# Patient Record
Sex: Female | Born: 1950
Health system: Southern US, Community
[De-identification: ages and names within clinical notes are randomized; demographics above are authoritative.]

## PROBLEM LIST (undated history)

## (undated) DIAGNOSIS — R059 Cough, unspecified: Secondary | ICD-10-CM

## (undated) DIAGNOSIS — N814 Uterovaginal prolapse, unspecified: Secondary | ICD-10-CM

## (undated) DIAGNOSIS — R05 Cough: Secondary | ICD-10-CM

## (undated) DIAGNOSIS — I10 Essential (primary) hypertension: Secondary | ICD-10-CM

## (undated) HISTORY — DX: Cough: R05

## (undated) HISTORY — PX: KNEE SURGERY: SHX244

## (undated) HISTORY — DX: Essential (primary) hypertension: I10

## (undated) HISTORY — DX: Cough, unspecified: R05.9

---

## 2001-12-16 ENCOUNTER — Ambulatory Visit (HOSPITAL_COMMUNITY): Admission: RE | Admit: 2001-12-16 | Discharge: 2001-12-16 | Payer: Self-pay | Admitting: Internal Medicine

## 2002-11-14 ENCOUNTER — Encounter (INDEPENDENT_AMBULATORY_CARE_PROVIDER_SITE_OTHER): Payer: Self-pay | Admitting: Internal Medicine

## 2003-09-02 ENCOUNTER — Emergency Department (HOSPITAL_COMMUNITY): Admission: EM | Admit: 2003-09-02 | Discharge: 2003-09-02 | Payer: Self-pay | Admitting: Emergency Medicine

## 2003-09-26 ENCOUNTER — Encounter (HOSPITAL_COMMUNITY): Admission: RE | Admit: 2003-09-26 | Discharge: 2003-10-26 | Payer: Self-pay | Admitting: Family Medicine

## 2006-02-09 ENCOUNTER — Other Ambulatory Visit: Admission: RE | Admit: 2006-02-09 | Discharge: 2006-02-09 | Payer: Self-pay | Admitting: Internal Medicine

## 2006-02-09 ENCOUNTER — Ambulatory Visit: Payer: Self-pay | Admitting: Internal Medicine

## 2006-02-09 ENCOUNTER — Encounter (INDEPENDENT_AMBULATORY_CARE_PROVIDER_SITE_OTHER): Payer: Self-pay | Admitting: Specialist

## 2006-02-09 LAB — CONVERTED CEMR LAB: Pap Smear: NORMAL

## 2006-02-17 ENCOUNTER — Ambulatory Visit: Payer: Self-pay | Admitting: Family Medicine

## 2006-02-17 ENCOUNTER — Encounter (INDEPENDENT_AMBULATORY_CARE_PROVIDER_SITE_OTHER): Payer: Self-pay | Admitting: Internal Medicine

## 2006-02-18 ENCOUNTER — Encounter (INDEPENDENT_AMBULATORY_CARE_PROVIDER_SITE_OTHER): Payer: Self-pay | Admitting: Internal Medicine

## 2006-02-18 LAB — CONVERTED CEMR LAB
ALT: 24 units/L
AST: 30 units/L
Albumin: 4.4 g/dL
Alkaline Phosphatase: 78 units/L
BUN: 13 mg/dL
Basophils Absolute: 0.1 10*3/uL
Basophils Relative: 1 %
CO2: 24 meq/L
Calcium: 9 mg/dL
Chloride: 104 meq/L
Creatinine, Ser: 0.8 mg/dL
Eosinophils Absolute: 0.3 10*3/uL
Eosinophils Relative: 3 %
Glucose, Bld: 100 mg/dL
HCT: 44.5 %
Hemoglobin: 15.3 g/dL
Lymphocytes Relative: 18 %
Lymphs Abs: 1.8 10*3/uL
MCHC: 34.4 g/dL
MCV: 80.8 fL
Monocytes Absolute: 0.4 10*3/uL
Monocytes Relative: 4 %
Neutro Abs: 7.5 10*3/uL
Neutrophils Relative %: 74 %
Platelets: 255 10*3/uL
Potassium: 3.8 meq/L
RBC: 5.51 M/uL
RDW: 13.1 %
Sodium: 139 meq/L
TSH: 2.221 microintl units/mL
Total Bilirubin: 0.3 mg/dL
Total Protein: 7.8 g/dL
WBC: 10.1 10*3/uL

## 2006-02-26 ENCOUNTER — Ambulatory Visit: Payer: Self-pay | Admitting: Internal Medicine

## 2006-03-16 ENCOUNTER — Encounter (INDEPENDENT_AMBULATORY_CARE_PROVIDER_SITE_OTHER): Payer: Self-pay | Admitting: Internal Medicine

## 2006-03-16 ENCOUNTER — Encounter: Admission: RE | Admit: 2006-03-16 | Discharge: 2006-03-16 | Payer: Self-pay | Admitting: Internal Medicine

## 2006-03-24 ENCOUNTER — Encounter (INDEPENDENT_AMBULATORY_CARE_PROVIDER_SITE_OTHER): Payer: Self-pay | Admitting: Internal Medicine

## 2006-03-27 ENCOUNTER — Encounter (INDEPENDENT_AMBULATORY_CARE_PROVIDER_SITE_OTHER): Payer: Self-pay | Admitting: Internal Medicine

## 2006-11-10 ENCOUNTER — Ambulatory Visit: Payer: Self-pay | Admitting: Internal Medicine

## 2006-11-10 DIAGNOSIS — J309 Allergic rhinitis, unspecified: Secondary | ICD-10-CM | POA: Insufficient documentation

## 2006-11-10 DIAGNOSIS — I10 Essential (primary) hypertension: Secondary | ICD-10-CM | POA: Insufficient documentation

## 2006-11-10 DIAGNOSIS — N318 Other neuromuscular dysfunction of bladder: Secondary | ICD-10-CM

## 2006-11-10 DIAGNOSIS — Z8679 Personal history of other diseases of the circulatory system: Secondary | ICD-10-CM | POA: Insufficient documentation

## 2006-11-10 DIAGNOSIS — F411 Generalized anxiety disorder: Secondary | ICD-10-CM | POA: Insufficient documentation

## 2006-11-10 DIAGNOSIS — R87619 Unspecified abnormal cytological findings in specimens from cervix uteri: Secondary | ICD-10-CM

## 2006-11-10 LAB — CONVERTED CEMR LAB: Inflenza A Ag: NEGATIVE

## 2006-11-27 ENCOUNTER — Encounter (INDEPENDENT_AMBULATORY_CARE_PROVIDER_SITE_OTHER): Payer: Self-pay | Admitting: Internal Medicine

## 2007-01-07 ENCOUNTER — Telehealth (INDEPENDENT_AMBULATORY_CARE_PROVIDER_SITE_OTHER): Payer: Self-pay | Admitting: Internal Medicine

## 2007-01-12 ENCOUNTER — Encounter (INDEPENDENT_AMBULATORY_CARE_PROVIDER_SITE_OTHER): Payer: Self-pay | Admitting: Internal Medicine

## 2007-01-14 ENCOUNTER — Encounter (INDEPENDENT_AMBULATORY_CARE_PROVIDER_SITE_OTHER): Payer: Self-pay | Admitting: Internal Medicine

## 2007-01-15 ENCOUNTER — Ambulatory Visit: Payer: Self-pay | Admitting: Internal Medicine

## 2007-01-17 LAB — CONVERTED CEMR LAB
BUN: 12 mg/dL (ref 6–23)
CO2: 23 meq/L (ref 19–32)
Calcium: 9.5 mg/dL (ref 8.4–10.5)
Chloride: 103 meq/L (ref 96–112)
Cholesterol: 174 mg/dL (ref 0–200)
Creatinine, Ser: 0.69 mg/dL (ref 0.40–1.20)
Glucose, Bld: 88 mg/dL (ref 70–99)
HDL: 71 mg/dL (ref >39)
LDL Cholesterol: 80 mg/dL (ref 0–99)
Potassium: 3.8 meq/L (ref 3.5–5.3)
Sodium: 140 meq/L (ref 135–145)
Total CHOL/HDL Ratio: 2.5
Triglycerides: 114 mg/dL (ref <150)
VLDL: 23 mg/dL (ref 0–40)

## 2007-07-12 ENCOUNTER — Telehealth (INDEPENDENT_AMBULATORY_CARE_PROVIDER_SITE_OTHER): Payer: Self-pay | Admitting: Internal Medicine

## 2007-08-26 ENCOUNTER — Ambulatory Visit: Payer: Self-pay | Admitting: Internal Medicine

## 2007-08-27 ENCOUNTER — Telehealth (INDEPENDENT_AMBULATORY_CARE_PROVIDER_SITE_OTHER): Payer: Self-pay | Admitting: *Deleted

## 2007-08-27 LAB — CONVERTED CEMR LAB
BUN: 10 mg/dL (ref 6–23)
CO2: 23 meq/L (ref 19–32)
Calcium: 9.2 mg/dL (ref 8.4–10.5)
Chloride: 104 meq/L (ref 96–112)
Creatinine, Ser: 0.73 mg/dL (ref 0.40–1.20)
Glucose, Bld: 90 mg/dL (ref 70–99)
Potassium: 3.8 meq/L (ref 3.5–5.3)
Sodium: 143 meq/L (ref 135–145)

## 2007-09-18 ENCOUNTER — Emergency Department (HOSPITAL_COMMUNITY): Admission: EM | Admit: 2007-09-18 | Discharge: 2007-09-18 | Payer: Self-pay | Admitting: Emergency Medicine

## 2007-11-09 ENCOUNTER — Ambulatory Visit: Payer: Self-pay | Admitting: Internal Medicine

## 2007-11-25 ENCOUNTER — Telehealth (INDEPENDENT_AMBULATORY_CARE_PROVIDER_SITE_OTHER): Payer: Self-pay | Admitting: Internal Medicine

## 2007-12-02 ENCOUNTER — Ambulatory Visit: Payer: Self-pay | Admitting: Internal Medicine

## 2007-12-16 ENCOUNTER — Ambulatory Visit: Payer: Self-pay | Admitting: Internal Medicine

## 2007-12-24 ENCOUNTER — Telehealth (INDEPENDENT_AMBULATORY_CARE_PROVIDER_SITE_OTHER): Payer: Self-pay | Admitting: Internal Medicine

## 2008-03-14 ENCOUNTER — Ambulatory Visit: Payer: Self-pay | Admitting: Internal Medicine

## 2008-03-14 DIAGNOSIS — J45901 Unspecified asthma with (acute) exacerbation: Secondary | ICD-10-CM | POA: Insufficient documentation

## 2008-03-14 DIAGNOSIS — J45909 Unspecified asthma, uncomplicated: Secondary | ICD-10-CM | POA: Insufficient documentation

## 2008-03-15 LAB — CONVERTED CEMR LAB
BUN: 10 mg/dL (ref 6–23)
CO2: 24 meq/L (ref 19–32)
Calcium: 9.6 mg/dL (ref 8.4–10.5)
Chloride: 104 meq/L (ref 96–112)
Cholesterol: 181 mg/dL (ref 0–200)
Creatinine, Ser: 0.65 mg/dL (ref 0.40–1.20)
Glucose, Bld: 93 mg/dL (ref 70–99)
HDL: 66 mg/dL (ref >39)
LDL Cholesterol: 91 mg/dL (ref 0–99)
Potassium: 3.4 meq/L — ABNORMAL LOW (ref 3.5–5.3)
Sodium: 144 meq/L (ref 135–145)
Total CHOL/HDL Ratio: 2.7
Triglycerides: 118 mg/dL (ref <150)
VLDL: 24 mg/dL (ref 0–40)

## 2008-03-28 ENCOUNTER — Ambulatory Visit: Payer: Self-pay | Admitting: Internal Medicine

## 2008-03-28 DIAGNOSIS — K4090 Unilateral inguinal hernia, without obstruction or gangrene, not specified as recurrent: Secondary | ICD-10-CM | POA: Insufficient documentation

## 2008-03-28 DIAGNOSIS — E876 Hypokalemia: Secondary | ICD-10-CM

## 2008-03-29 LAB — CONVERTED CEMR LAB: Potassium: 3.4 meq/L — ABNORMAL LOW (ref 3.5–5.3)

## 2008-05-12 ENCOUNTER — Emergency Department (HOSPITAL_COMMUNITY): Admission: EM | Admit: 2008-05-12 | Discharge: 2008-05-12 | Payer: Self-pay | Admitting: Emergency Medicine

## 2008-05-16 ENCOUNTER — Ambulatory Visit: Payer: Self-pay | Admitting: Internal Medicine

## 2008-06-01 ENCOUNTER — Ambulatory Visit: Payer: Self-pay | Admitting: Internal Medicine

## 2008-06-12 ENCOUNTER — Encounter (INDEPENDENT_AMBULATORY_CARE_PROVIDER_SITE_OTHER): Payer: Self-pay | Admitting: Internal Medicine

## 2008-07-03 ENCOUNTER — Telehealth (INDEPENDENT_AMBULATORY_CARE_PROVIDER_SITE_OTHER): Payer: Self-pay | Admitting: Internal Medicine

## 2008-07-13 ENCOUNTER — Ambulatory Visit: Payer: Self-pay | Admitting: Internal Medicine

## 2008-09-05 ENCOUNTER — Ambulatory Visit: Payer: Self-pay | Admitting: Internal Medicine

## 2008-09-05 DIAGNOSIS — J069 Acute upper respiratory infection, unspecified: Secondary | ICD-10-CM | POA: Insufficient documentation

## 2009-04-20 ENCOUNTER — Ambulatory Visit: Payer: Self-pay | Admitting: Internal Medicine

## 2009-04-23 LAB — CONVERTED CEMR LAB
BUN: 9 mg/dL
CO2: 24 meq/L
Calcium: 9.4 mg/dL
Chloride: 104 meq/L
Cholesterol: 170 mg/dL
Creatinine, Ser: 0.79 mg/dL
Glucose, Bld: 83 mg/dL
HDL: 71 mg/dL
LDL Cholesterol: 81 mg/dL
Potassium: 3.8 meq/L
Sodium: 141 meq/L
Total CHOL/HDL Ratio: 2.4
Triglycerides: 88 mg/dL
VLDL: 18 mg/dL

## 2010-10-14 ENCOUNTER — Encounter: Payer: Self-pay | Admitting: Internal Medicine

## 2010-11-16 ENCOUNTER — Emergency Department (HOSPITAL_COMMUNITY): Payer: 59

## 2010-11-16 ENCOUNTER — Emergency Department (HOSPITAL_COMMUNITY)
Admission: EM | Admit: 2010-11-16 | Discharge: 2010-11-17 | Disposition: A | Discharge status: Home / Self Care | Payer: 59 | Attending: Emergency Medicine | Admitting: Emergency Medicine

## 2010-11-16 DIAGNOSIS — I10 Essential (primary) hypertension: Secondary | ICD-10-CM | POA: Insufficient documentation

## 2010-11-16 DIAGNOSIS — Z79899 Other long term (current) drug therapy: Secondary | ICD-10-CM | POA: Insufficient documentation

## 2010-11-16 DIAGNOSIS — M25619 Stiffness of unspecified shoulder, not elsewhere classified: Secondary | ICD-10-CM | POA: Insufficient documentation

## 2010-11-16 DIAGNOSIS — M25519 Pain in unspecified shoulder: Secondary | ICD-10-CM | POA: Insufficient documentation

## 2011-01-02 ENCOUNTER — Emergency Department (HOSPITAL_COMMUNITY)
Admission: EM | Admit: 2011-01-02 | Discharge: 2011-01-02 | Disposition: A | Discharge status: Home / Self Care | Payer: 59 | Attending: Emergency Medicine | Admitting: Emergency Medicine

## 2011-01-02 ENCOUNTER — Emergency Department (HOSPITAL_COMMUNITY): Payer: 59

## 2011-01-02 DIAGNOSIS — R0602 Shortness of breath: Secondary | ICD-10-CM | POA: Insufficient documentation

## 2011-01-02 DIAGNOSIS — I10 Essential (primary) hypertension: Secondary | ICD-10-CM | POA: Insufficient documentation

## 2011-01-02 DIAGNOSIS — J45901 Unspecified asthma with (acute) exacerbation: Secondary | ICD-10-CM | POA: Insufficient documentation

## 2011-02-07 NOTE — Op Note (Signed)
[facility]  Patient:    Katherine Rose, Katherine Rose Visit Number: [id number] MRN: [id number]          Service Type: END Location: DAY Attending Physician:  Jonathon Bellows Dictated by:   Roetta Sessions, M.D. Admit Date:  12/16/2001   CC:         Dr. Massie Kluver, [address], [facility],             [place]   Operative Report  PROCEDURE:  Screening colonoscopy.  ENDOSCOPIST:  Roetta Sessions, M.D.  INDICATIONS FOR PROCEDURE:  This patient is a 60 year old lady.  Both parents have a history of precancerous colon polyps found in their colon in their 21s. She is referred by Dr. Modena Jansky for high-risk screening colonoscopy.  She is avoid of any GI symptoms.  This approach has been discussed with the patient previously.  The potential risks, benefits, and alternatives have been reviewed; questions answered; she is agreeable.  Please see the dictated consultation for more information.  PROCEDURE NOTE:  O2 saturation, blood pressure, pulse oximetry were monitored throughout the entire procedure.  CONSCIOUS SEDATION:  Versed 4 mg IV, Demerol 100 mg IV in divided doses.  INSTRUMENT:  Olympus videochip colonoscope.  FINDINGS:  Digital rectal exam revealed no abnormalities.  VIDEOSCOPIC FINDINGS:  The prep was good.  RECTUM:  Examination of the rectal mucosa including a retroflex view of the anal verge revealed only internal hemorrhoids.  COLON:  The colonic mucosa was surveyed from the rectosigmoid junction through the left transverse right colon to the area of the appendiceal orifice, ileocecal valve, and cecum.  The colonic mucosa appeared entirely normal all the way to the cecum.  The cecum, ileocecal valve and appendiceal orifice were photographed for the record.  From this level the scope was slowly and cautiously withdrawn.  All previously mentioned mucosa surfaces were again seen.  No other abnormalities were  observed.  The patient tolerated the procedure well and was reacted in endoscopy.  IMPRESSION: 1. Internal hemorrhoids, otherwise normal rectum. 2. Normal colon.  RECOMMENDATIONS:  Repeat colonoscopy in 5 years. Dictated by:   Roetta Sessions, M.D. Attending Physician:  Jonathon Bellows DD:  12/16/01 TD:  12/17/01 Job: [id number]

## 2011-06-30 ENCOUNTER — Ambulatory Visit (INDEPENDENT_AMBULATORY_CARE_PROVIDER_SITE_OTHER): Payer: 59 | Admitting: Surgery

## 2011-06-30 ENCOUNTER — Encounter (INDEPENDENT_AMBULATORY_CARE_PROVIDER_SITE_OTHER): Payer: Self-pay | Admitting: Surgery

## 2011-06-30 DIAGNOSIS — K4090 Unilateral inguinal hernia, without obstruction or gangrene, not specified as recurrent: Secondary | ICD-10-CM

## 2011-06-30 NOTE — Progress Notes (Signed)
Subjective:     Patient ID: Katherine Rose, female   DOB: [birth year], 60 y.o.   MRN: [id number]  HPI  Patient Care Team: Erle Crocker as PCP - General Mickel Baas as Consulting Physician (Obstetrics and Gynecology)  This patient is a 60 y.o.female who presents today for surgical evaluation.   Reason for visit: Left groin swelling. Probable hernia.  The patient has any active healthy female. She notes a few months ago swelling in her left groin. Interestingly, it has been on her problem list since 2009.  She saw her gynecologist who detected a hernia. Therefore he sent the patient to me for surgical evaluation. The patient does struggle with constipation. She occasionally takes stool softeners. Normal colonoscopies in the past. 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 No severe heavy lifting. She can walk 20 minutes without much difficulty. She does have hypertension but no major cardiac issues. No strokes. No COPD.  She denies any inflammatory bowel disease. No internal bowel syndrome. No food intolerances. No history of ulcers or reflux. She does note she gets an intermittent abdominal pain in the upper abdomen. Sometimes it's related to eating versus activity. No severe nausea or vomiting with it. No radiation to the back or shoulder. Rather central not right-sided. More periumbilical.   Past Medical History  Diagnosis Date  . Asthma   . Diabetes mellitus     borderline. controlled by diet  . Hypertension     Past Surgical History  Procedure Date  . Knee surgery     right    History   Social History  . Marital Status: Widowed    Spouse Name: N/A    Number of Children: N/A  . Years of Education: N/A   Occupational History  . Not on file.   Social History Main Topics  . Smoking status: Never Smoker   . Smokeless tobacco: Not on file  . Alcohol Use: No  . Drug Use: No  . Sexually Active:    Other Topics Concern  .  Not on file   Social History Narrative  . No narrative on file    History reviewed. No pertinent family history.  Current outpatient prescriptions:Cholecalciferol (VITAMIN D3) 50000 UNITS CAPS, Take by mouth once a week.  , Disp: , Rfl: ;  felodipine (PLENDIL) 5 MG 24 hr tablet, Take 5 mg by mouth daily.  , Disp: , Rfl: ;  Fluticasone-Salmeterol (ADVAIR) 100-50 MCG/DOSE AEPB, Inhale 1 puff into the lungs every 12 (twelve) hours.  , Disp: , Rfl: ;  hydrochlorothiazide (HYDRODIURIL) 25 MG tablet, Take 25 mg by mouth daily.  , Disp: , Rfl:   Allergies  Allergen Reactions  . Penicillins        Review of Systems  Constitutional: Negative for fever, chills, diaphoresis, appetite change and fatigue.  HENT: Negative for ear pain, sore throat, trouble swallowing, neck pain and ear discharge.   Eyes: Negative for photophobia, discharge and visual disturbance.  Respiratory: Negative for cough, choking, chest tightness and shortness of breath.   Cardiovascular: Negative for chest pain and palpitations.  Gastrointestinal: Positive for constipation. Negative for nausea, vomiting, diarrhea, anal bleeding and rectal pain.       Central abd pain  Genitourinary: Negative for dysuria, frequency and difficulty urinating.  Musculoskeletal: Negative for myalgias and gait problem.  Skin: Negative for color change, pallor and rash.  Neurological: Negative for dizziness, speech difficulty, weakness and numbness.  Hematological: Negative  for adenopathy.  Psychiatric/Behavioral: Negative for confusion and agitation. The patient is not nervous/anxious.        Objective:   Physical Exam  Constitutional: She is oriented to person, place, and time. She appears well-developed and well-nourished. No distress.  HENT:  Head: Normocephalic.  Mouth/Throat: Oropharynx is clear and moist. No oropharyngeal exudate.  Eyes: Conjunctivae and EOM are normal. Pupils are equal, round, and reactive to light. No scleral  icterus.  Neck: Normal range of motion. Neck supple. No tracheal deviation present.  Cardiovascular: Normal rate, regular rhythm and intact distal pulses.   Pulmonary/Chest: Effort normal and breath sounds normal. No respiratory distress. She exhibits no tenderness.  Abdominal: Soft. She exhibits no distension and no mass. There is no tenderness. Hernia confirmed negative in the right inguinal area.  Genitourinary: No vaginal discharge found.       Mod LIH out in groin, reducible  Musculoskeletal: Normal range of motion. She exhibits no tenderness.  Lymphadenopathy:    She has no cervical adenopathy.       Right: No inguinal adenopathy present.       Left: No inguinal adenopathy present.  Neurological: She is alert and oriented to person, place, and time. No cranial nerve deficit. She exhibits normal muscle tone. Coordination normal.  Skin: Skin is warm and dry. No rash noted. She is not diaphoretic. No erythema.  Psychiatric: She has a normal mood and affect. Her behavior is normal. Judgment and thought content normal.       Assessment:     LIH   Chronic constipation.  Upper abd pain    Plan:     I recommend she repair left inguinal hernia. She is a candidate for laparoscopic repair. She a lot questions about her central upper abdominal pain and if it was related to this. I think constipation and hernia can contribute to it.   I recommend she get a bowel regimen to get her bowels regular, get the hernia fixed, and then regroup.   I cannot guarantee that solves her abdominal pain problems. Perhaps a gastroenterology reevaluation is warranted if the abdominal pain issues persist.   The anatomy & physiology of the abdominal wall was discussed.  The pathophysiology of hernias was discussed.  Natural history risks without surgery of enlargement, pain, incarceration & strangulation was discussed.   Contributors to complications such as smoking, obesity, diabetes, prior surgery, etc were  discussed.  I feel the risks of no intervention will lead to serious problems that outweigh the operative risks; therefore, I recommended surgery to reduce and repair the left inguinal hernia.  I explained laparoscopic techniques with possible need for an open approach.  I noted the probable use of mesh to patch and/or buttress hernia repair  Risks such as bleeding, infection, abscess, need for further treatment, heart attack, death, and other risks were discussed.  Goals of post-operative recovery were discussed as well.  Possibility that this will not correct all symptoms was explained.  I stressed the importance of low-impact activity, aggressive pain control, avoiding constipation, & not pushing through pain to minimize risk of post-operative chronic pain or injury. Possibility of reherniation was discussed.  We will work to minimize complications.   An educational handout further explaining the pathology & treatment options was given as well.  Questions were answered.  The patient expresses understanding & wishes to proceed with surgery.  I stressed the importance of a bowel regimen to have daily soft bowel movements to minimize progression of disease  of constipation.

## 2011-06-30 NOTE — Patient Instructions (Addendum)
Inguinal Hernia, Adult Muscles help keep everything in the body in its proper place. But if a weak spot in the muscles develops, something can poke through. That is called a hernia. When this happens in the lower part of the belly (abdomen), it is called an inguinal hernia. (It takes its name from a part of the body in this region called the inguinal canal.) A weak spot in the wall of muscles lets some fat or part of the small intestine bulge through. An inguinal hernia can develop at any age. Men get them more often than women. CAUSES In adults, an inguinal hernia develops over time.  It can be triggered by:   Suddenly straining the muscles of the lower abdomen.   Lifting heavy objects.   Straining to have a bowel movement. Difficult bowel movements (constipation) can lead to this.   Constant coughing. This may be caused by smoking or lung disease.   Being overweight.   Being pregnant.   Working at a job that requires long periods of standing or heavy lifting.   Having had an inguinal hernia before.  One type can be an emergency situation. It is called a strangulated inguinal hernia. It develops if part of the small intestine slips through the weak spot and cannot get back into the abdomen. The blood supply can be cut off. If that happens, part of the intestine may die. This situation requires emergency surgery. SYMPTOMS Often, a small inguinal hernia has no symptoms. It is found when a healthcare provider does a physical exam. Larger hernias usually have symptoms.   In adults, symptoms may include:   A lump in the groin. This is easier to see when the person is standing. It might disappear when lying down.   In men, a lump in the scrotum.   Pain or burning in the groin. This occurs especially when lifting, straining or coughing.   A dull ache or feeling of pressure in the groin.   Signs of a strangulated hernia can include:   A bulge in the groin that becomes very painful and  tender to the touch.   A bulge that turns red or purple.   Fever, nausea and vomiting.   Inability to have a bowel movement or to pass gas.  DIAGNOSIS To decide if you have an inguinal hernia, a healthcare provider will probably do a physical examination.  This will include asking questions about any symptoms you have noticed.   The healthcare provider might feel the groin area and ask you to cough. If an inguinal hernia is felt, the healthcare provider may try to slide it back into the abdomen.   Usually no other tests are needed.  TREATMENT Treatments can vary. The size of the hernia makes a difference. Options include:  Watchful waiting. This is often suggested if the hernia is small and you have had no symptoms.   No medical procedure will be done unless symptoms develop.   You will need to watch closely for symptoms. If any occur, contact your healthcare provider right away.   Surgery. This is used if the hernia is larger or you have symptoms.   Open surgery. This is usually an outpatient procedure (you will not stay overnight in a hospital). An cut (incision) is made through the skin in the groin. The hernia is put back inside the abdomen. The weak area in the muscles is then repaired by:  --Herniorrhaphy. In this type of surgery, the weak muscles are sewn  back together. --Hernioplasty. A patch or mesh is used to close the weak area in the abdominal wall.   Laparoscopy. In this procedure, a surgeon makes small incisions. A thin tube with a tiny video camera (called a laparoscope) is put into the abdomen. The surgeon repairs the hernia with mesh by looking with the video camera and using two long instruments.  HOME CARE INSTRUCTIONS  After surgery to repair an inguinal hernia:   You will need to take pain medicine prescribed by your healthcare provider. Follow all directions carefully.   You will need to take care of the wound from the incision.   Your activity will be  restricted for awhile. This will probably include no heavy lifting for several weeks. You also should not do anything too active for a few weeks. When you can return to work will depend on the type of job that you have.   During "watchful waiting" periods, you should:   Maintain a healthy weight.   Eat a diet high in fiber (fruits, vegetables and whole grains).   Drink plenty of fluids to avoid constipation. This means drinking enough water and other liquids to keep your urine clear or pale yellow.   Do not lift heavy objects.   Do not stand for long periods of time.   Quit smoking. This should keep you from developing a frequent cough.  SEEK MEDICAL CARE IF:  A bulge develops in your groin area.   You feel pain, a burning sensation or pressure in the groin. This might be worse if you are lifting or straining.   You develop a fever of more than 100.3F (38.1 C).  SEEK IMMEDIATE MEDICAL CARE IF:  Pain in the groin increases suddenly.   A bulge in the groin gets bigger suddenly and does not go down.   For men, there is sudden pain in the scrotum. Or, the size of the scrotum increases.   A bulge in the groin area becomes red or purple and is painful to touch.   You have nausea or vomiting that does not go away.   You feel your heart beating much faster than normal.   You cannot have a bowel movement or pass gas.   You develop a fever of more than 102.25F (38.9C).  Document Released: 01/25/2009 Document Re-Released: 02/26/2010 [facility] Patient Information 2011 [place], [place].  GETTING TO GOOD BOWEL HEALTH. Irregular bowel habits such as constipation and diarrhea can lead to many problems over time.  Having one soft bowel movement a day is the most important way to prevent further problems.  The anorectal canal is designed to handle stretching and feces to safely manage our ability to get rid of solid waste (feces, poop, stool) out of our body.  BUT, hard constipated stools  can act like ripping concrete bricks and diarrhea can be a burning fire to this very sensitive area of our body, causing inflamed hemorrhoids, anal fissures, increasing risk is perirectal abscesses, abdominal pain/bloating, an making irritable bowel worse.     The goal: ONE SOFT BOWEL MOVEMENT A DAY!  To have soft, regular bowel movements:    Drink at least 8 tall glasses of water a day.     Take plenty of fiber.  Fiber is the undigested part of plant food that passes into the colon, acting s "natures broom" to encourage bowel motility and movement.  Fiber can absorb and hold large amounts of water. This results in a larger, bulkier stool, which is  soft and easier to pass. Work gradually over several weeks up to 6 servings a day of fiber (25g a day even more if needed) in the form of: o Vegetables -- Root (potatoes, carrots, turnips), leafy green (lettuce, salad greens, celery, spinach), or cooked high residue (cabbage, broccoli, etc) o Fruit -- Fresh (unpeeled skin & pulp), Dried (prunes, apricots, cherries, etc ),  or stewed ( applesauce)  o Whole grain breads, pasta, etc (whole wheat)  o Bran cereals    Bulking Agents -- This type of water-retaining fiber generally is easily obtained each day by one of the following:  o Psyllium bran -- The psyllium plant is remarkable because its ground seeds can retain so much water. This product is available as Metamucil, Konsyl, Effersyllium, Per Diem Fiber, or the less expensive generic preparation in drug and health food stores. Although labeled a laxative, it really is not a laxative.  o Methylcellulose -- This is another fiber derived from wood which also retains water. It is available as Citrucel. o Polyethylene Glycol - and "artificial" fiber commonly called Miralax or Glycolax.  It is helpful for people with gassy or bloated feelings with regular fiber o Flax Seed - a less gassy fiber than psyllium   No reading or other relaxing activity while on the  toilet. If bowel movements take longer than 5 minutes, you are too constipated   AVOID CONSTIPATION.  High fiber and water intake usually takes care of this.  Sometimes a laxative is needed to stimulate more frequent bowel movements, but    Laxatives are not a good long-term solution as it can wear the colon out. o Osmotics (Milk of Magnesia, Fleets phosphosoda, Magnesium citrate, MiraLax, GoLytely) are safer than  o Stimulants (Senokot, Castor Oil, Dulcolax, Ex Lax)    o Do not take laxatives for more than 7days in a row.    IF SEVERELY CONSTIPATED, try a Bowel Retraining Program: o Do not use laxatives.  o Eat a diet high in roughage, such as bran cereals and leafy vegetables.  o Drink six (6) ounces of prune or apricot juice each morning.  o Eat two (2) large servings of stewed fruit each day.  o Take one (1) heaping tablespoon of a psyllium-based bulking agent twice a day. Use sugar-free sweetener when possible to avoid excessive calories.  o Eat a normal breakfast.  o Set aside 15 minutes after breakfast to sit on the toilet, but do not strain to have a bowel movement.  o If you do not have a bowel movement by the third day, use an enema and repeat the above steps.    Controlling diarrhea o Switch to liquids and simpler foods for a few days to avoid stressing your intestines further. o Avoid dairy products (especially milk & ice cream) for a short time.  The intestines often can lose the ability to digest lactose when stressed. o Avoid foods that cause gassiness or bloating.  Typical foods include beans and other legumes, cabbage, broccoli, and dairy foods.  Every person has some sensitivity to other foods, so listen to our body and avoid those foods that trigger problems for you. o Adding fiber (Citrucel, Metamucil, psyllium, Miralax) gradually can help thicken stools by absorbing excess fluid and retrain the intestines to act more normally.  Slowly increase the dose over a few weeks.  Too  much fiber too soon can backfire and cause cramping & bloating. o Probiotics (such as active yogurt, Align, etc) may help repopulate  the intestines and colon with normal bacteria and calm down a sensitive digestive tract.  Most studies show it to be of mild help, though, and such products can be costly. o Medicines:   Bismuth subsalicylate (ex. Kayopectate, Pepto Bismol) every 30 minutes for up to 6 doses can help control diarrhea.  Avoid if pregnant.   Loperamide (Immodium) can slow down diarrhea.  Start with two tablets (3m total) first and then try one tablet every 6 hours.  Avoid if you are having fevers or severe pain.  If you are not better or start feeling worse, stop all medicines and call your doctor for advice o Call your doctor if you are getting worse or not better.  Sometimes further testing (cultures, endoscopy, X-ray studies, bloodwork, etc) may be needed to help diagnose and treat the cause of the diarrhea. o

## 2011-07-08 ENCOUNTER — Encounter (HOSPITAL_COMMUNITY): Payer: 59

## 2011-07-08 ENCOUNTER — Other Ambulatory Visit (INDEPENDENT_AMBULATORY_CARE_PROVIDER_SITE_OTHER): Payer: Self-pay | Admitting: Surgery

## 2011-07-08 LAB — BASIC METABOLIC PANEL
BUN: 10 mg/dL (ref 6–23)
CO2: 28 mEq/L (ref 19–32)
Calcium: 9.4 mg/dL (ref 8.4–10.5)
Chloride: 104 mEq/L (ref 96–112)
Creatinine, Ser: 0.72 mg/dL (ref 0.50–1.10)
GFR calc Af Amer: >90 mL/min (ref >90)
GFR calc non Af Amer: >90 mL/min (ref >90)
Glucose, Bld: 84 mg/dL (ref 70–99)
Potassium: 3.7 mEq/L (ref 3.5–5.1)
Sodium: 142 mEq/L (ref 135–145)

## 2011-07-08 LAB — CBC
HCT: 40 % (ref 36.0–46.0)
Hemoglobin: 13.5 g/dL (ref 12.0–15.0)
MCH: 27.8 pg (ref 26.0–34.0)
MCHC: 33.8 g/dL (ref 30.0–36.0)
MCV: 82.5 fL (ref 78.0–100.0)
Platelets: 239 10*3/uL (ref 150–400)
RBC: 4.85 MIL/uL (ref 3.87–5.11)
RDW: 12.4 % (ref 11.5–15.5)
WBC: 6.9 10*3/uL (ref 4.0–10.5)

## 2011-07-08 LAB — SURGICAL PCR SCREEN
MRSA, PCR: NEGATIVE
Staphylococcus aureus: NEGATIVE

## 2011-07-09 ENCOUNTER — Ambulatory Visit (HOSPITAL_COMMUNITY)
Admission: RE | Admit: 2011-07-09 | Discharge: 2011-07-09 | Disposition: A | Discharge status: Home / Self Care | Payer: 59 | Source: Ambulatory Visit | Attending: Surgery | Admitting: Surgery

## 2011-07-09 DIAGNOSIS — Z01810 Encounter for preprocedural cardiovascular examination: Secondary | ICD-10-CM | POA: Insufficient documentation

## 2011-07-09 DIAGNOSIS — I10 Essential (primary) hypertension: Secondary | ICD-10-CM | POA: Insufficient documentation

## 2011-07-09 DIAGNOSIS — K4090 Unilateral inguinal hernia, without obstruction or gangrene, not specified as recurrent: Secondary | ICD-10-CM | POA: Insufficient documentation

## 2011-07-09 DIAGNOSIS — Z01812 Encounter for preprocedural laboratory examination: Secondary | ICD-10-CM | POA: Insufficient documentation

## 2011-07-09 DIAGNOSIS — E669 Obesity, unspecified: Secondary | ICD-10-CM | POA: Insufficient documentation

## 2011-07-09 HISTORY — PX: HERNIA REPAIR: SHX51

## 2011-07-10 NOTE — Op Note (Signed)
Katherine Rose, Katherine Rose NO.:  [id number]  MEDICAL RECORD NO.:  [id number]  LOCATION:  DAYL                         FACILITY:  [facility]  PHYSICIAN:  Ardeth Sportsman, MD     DATE OF BIRTH:  1950-12-13  DATE OF PROCEDURE:  07/09/2011 DATE OF DISCHARGE:                              OPERATIVE REPORT   PRIMARY CARE PHYSICIAN:  Erle Crocker, MD  REQUESTING PHYSICIAN:  Ilda Mori, MD  SURGEON:  Ardeth Sportsman, MD  PREOPERATIVE DIAGNOSIS:  Left inguinal hernia.  POSTOPERATIVE DIAGNOSIS:  Left direct inguinal hernia.  PROCEDURE PERFORMED:  Laparoscopic left inguinal hernia repair with mesh.  ANESTHESIA: 1. General anesthesia. 2. Local anesthetic and a field block around all port sites. 3. Left ilioinguinal/genitofemoral/inguinal canal nerve blocks.  SPECIMENS:  None.  DRAINS:  None.  ESTIMATED BLOOD LOSS:  Less than 30 mL.  COMPLICATIONS:  None apparent.  INDICATIONS:  Katherine Rose is a 60 year old obese female who began to have some swelling in her left groin intermittently for the past 3 years.  It has become more symptomatic with her constipation.  She was detected by her gynecologist, sent the patient for a surgical evaluation.  The anatomy and physiology of abdominal wall formation were discussed and pathophysiology of herniation was discussed.  Options discussed. Recommendation made for laparoscopic or possible open inguinal hernia repair with mesh.  The risks, benefits, and alternatives were discussed. High likelihood of treating the pathology/symptomatology was discussed as well.  Questions answered and she agreed to proceed.  OPERATIVE FINDINGS:  She had a direct inguinal hernia.  She had some laxity with internal ring, but no definite indirect hernia.  Femoral and obturator foramina looked normal.  On the right, there was no direct space hernia nor any evidence of any femoral or indirect inguinal hernia.  DESCRIPTION OF PROCEDURE:   Informed consent was confirmed.  The patient underwent general anesthesia without any difficulty.  She voided prior to coming to the operating room.  She received IV clindamycin and gentamicin, given her other medication allergies.  She was positioned supine with arms tucked.  Her abdomen and mons pubis were clipped, prepped and draped in a sterile fashion.  Surgical time-out confirmed our plan.  I placed a 12 mm port in the preperitoneal plane through an infraumbilical incision.  I induced carbon dioxide insufflation.  I used a camera to free the peritoneum off bilateral lower quadrants.  I created enough working space such that a 5 mm port can be placed in the right and left mid abdomen.  I turned the attention towards the left lower side.  I freed the peritoneum off the anterior abdominal wall on the left flank.  I freed peritoneum up to the pubic rim.  I freed the left anterior bladder off the left pelvic sidewall.  This helped define the inguinal region well.  I could see a swath of peritoneum going up into a direct hernia.  I was able to reduce down some preperitoneal fat and hernia sac down.  I could see some peritoneum coming up to the internal ring, but no true indirect hernia.  I freed the peritoneum off the round ligament and peeled it proximally.  In doing that, there was a tear in the peritoneum at the round ligament.  I peeled that down until I got to the level of the uterus.  I closed that defect in the peritoneum using a 3-0 Vicryl stitch using laparoscopic intracorporeal suturing.  I freed the peritoneum more laterally and cephalad as well.  I made sure there were no peritoneal defects.  I did free the peritoneum off the right lower quadrant and freed off the direct space as well, there was no hernia.  There was no femoral or inguinal hernia.  Therefore, I did not fix anything on the right side.  I chose a 15 x 15 cm ultra lightweight polypropylene (ULTRAPRO) mesh  for the left side.  I cut a slit in the mesh such that a 6 x 7 cm mesh flap rested in the true pelvis between the lateral pelvic sidewall and the bladder.  It laid in a diamond-shaped pattern with the medial corner crossing over midline.  This provided 3 inches circumferential coverage around the direct hernia as well as the internal inguinal ring.  Femoral and obturator foramina were covered as well.  I had already done a high ligation of the hernia sac while closing the peritoneal defect.  I held the mesh in place and evacuated carbon dioxide.  I removed the ports.  I made sure there was no carbon dioxide in the peritoneal cavity.  Closed the infraumbilical fascia using 0 Vicryl interrupted stitches transversely.  I closed the skin.  Sterile dressings applied.  The patient was extubated and sent to recovery room in stable condition. I had discussed postop care with the patient in detail in the office.  I also did that in the holding area.  I have written instructions.  I will discuss with her sister as well.     Ardeth Sportsman, MD     SCG/MEDQ  D:  07/09/2011  T:  07/09/2011  Job:  [id number]  cc:   Ilda Mori, M.D. Fax: [phone]  Erle Crocker, M.D.  Electronically Signed by Karie Soda MD on 07/10/2011 11:10:57 AM

## 2011-07-14 ENCOUNTER — Telehealth (INDEPENDENT_AMBULATORY_CARE_PROVIDER_SITE_OTHER): Payer: Self-pay | Admitting: Surgery

## 2011-07-22 ENCOUNTER — Encounter (INDEPENDENT_AMBULATORY_CARE_PROVIDER_SITE_OTHER): Payer: Self-pay

## 2011-07-22 ENCOUNTER — Ambulatory Visit (INDEPENDENT_AMBULATORY_CARE_PROVIDER_SITE_OTHER): Payer: 59 | Admitting: Surgery

## 2011-07-22 ENCOUNTER — Encounter (INDEPENDENT_AMBULATORY_CARE_PROVIDER_SITE_OTHER): Payer: Self-pay | Admitting: Surgery

## 2011-07-22 VITALS — BP 180/104 | HR 80 | Temp 99.4°F | Resp 20 | Ht 66.0 in | Wt 144.1 lb

## 2011-07-22 DIAGNOSIS — K4090 Unilateral inguinal hernia, without obstruction or gangrene, not specified as recurrent: Secondary | ICD-10-CM

## 2011-07-22 DIAGNOSIS — J069 Acute upper respiratory infection, unspecified: Secondary | ICD-10-CM

## 2011-07-22 MED ORDER — AZITHROMYCIN 250 MG PO TABS: ORAL_TABLET | ORAL | Status: AC

## 2011-07-22 NOTE — Progress Notes (Signed)
Subjective:     Patient ID: Katherine Rose, female   DOB: 03/05/1951, 60 y.o.   MRN: [id number]  HPI  Patient Care Team: Erle Crocker as PCP - General Mickel Baas as Consulting Physician (Obstetrics and Gynecology)  This patient is a 60 y.o.female who presents today for surgical evaluation.   Procedure: Laparoscopic repair of hernia and left groin.  Patient comes in today feeling okay from the surgery. She did not require narcotics. She does do some ibuprfen. She's nearly weaned off. She's feeling well. She wonders if it's okay go back to work. She is concerned however somehow FMLA would work out. Normal bowel movements. No nausea or vomiting. Still has a sensitive of the bladder.  Her main complaint is a worsening cough for 10days. Initially she had clear sputum but now becoming more greenish and thick. Mild low-grade fevers but no sweats. No sick contacts. She's not tried antibiotics. He has been using over-the-counter medications but has not gotten  better.  Past Medical History  Diagnosis Date  . Asthma   . Diabetes mellitus     borderline. controlled by diet  . Hypertension   . Cough     Past Surgical History  Procedure Date  . Knee surgery     right  . Hernia repair 07/09/11    lap LIH repair with mesh     History   Social History  . Marital Status: Widowed    Spouse Name: N/A    Number of Children: N/A  . Years of Education: N/A   Occupational History  . Not on file.   Social History Main Topics  . Smoking status: Never Smoker   . Smokeless tobacco: Never Used  . Alcohol Use: No  . Drug Use: No  . Sexually Active:    Other Topics Concern  . Not on file   Social History Narrative  . No narrative on file    History reviewed. No pertinent family history.  Current outpatient prescriptions:Cholecalciferol (VITAMIN D3) 50000 UNITS CAPS, Take by mouth once a week.  , Disp: , Rfl: ;  felodipine (PLENDIL) 5 MG 24 hr tablet, Take 5 mg by mouth  daily.  , Disp: , Rfl: ;  Fluticasone-Salmeterol (ADVAIR) 100-50 MCG/DOSE AEPB, Inhale 1 puff into the lungs every 12 (twelve) hours.  , Disp: , Rfl: ;  hydrochlorothiazide (HYDRODIURIL) 25 MG tablet, Take 25 mg by mouth daily.  , Disp: , Rfl:  azithromycin (ZITHROMAX) 250 MG tablet, Take 2 tablets (500 mg) on  Day 1,  followed by 1 tablet (250 mg) once daily on Days 2 through 5., Disp: 6 each, Rfl: 0  Allergies  Allergen Reactions  . Penicillins        Review of Systems  Constitutional: Negative for fever, chills and diaphoresis.  HENT: Positive for congestion, sore throat, rhinorrhea and sinus pressure. Negative for hearing loss, ear pain, nosebleeds, trouble swallowing, voice change, tinnitus and ear discharge.   Eyes: Negative for photophobia and visual disturbance.  Respiratory: Positive for cough. Negative for apnea, choking, chest tightness, shortness of breath, wheezing and stridor.   Cardiovascular: Negative for chest pain and palpitations.  Gastrointestinal: Negative for nausea, vomiting, abdominal pain, diarrhea, constipation, anal bleeding and rectal pain.  Genitourinary: Negative for dysuria, urgency, frequency, decreased urine volume and difficulty urinating.  Musculoskeletal: Negative for myalgias, arthralgias and gait problem.  Skin: Negative for color change, pallor and rash.  Neurological: Negative for dizziness, speech difficulty, weakness and numbness.  Hematological: Negative  for adenopathy.  Psychiatric/Behavioral: Negative for confusion and agitation. The patient is not nervous/anxious.        Objective:   Physical Exam  Constitutional: She is oriented to person, place, and time. She appears well-developed and well-nourished. No distress.  HENT:  Head: Normocephalic.  Nose: Right sinus exhibits maxillary sinus tenderness. Left sinus exhibits maxillary sinus tenderness.  Mouth/Throat: Normal dentition. Posterior oropharyngeal edema and posterior oropharyngeal  erythema present. No oropharyngeal exudate or tonsillar abscesses.  Eyes: Conjunctivae and EOM are normal. Pupils are equal, round, and reactive to light. No scleral icterus.  Neck: Normal range of motion. No tracheal deviation present.  Cardiovascular: Normal rate and intact distal pulses.   Pulmonary/Chest: Effort normal. No respiratory distress. She exhibits no tenderness.  Abdominal: Soft. She exhibits no distension. There is no tenderness. Hernia confirmed negative in the right inguinal area and confirmed negative in the left inguinal area.       Incisions clean with normal healing ridges.  No hernias  Genitourinary: No vaginal discharge found.       2x2cm mobile left groin seroma  Musculoskeletal: Normal range of motion. She exhibits no tenderness.  Lymphadenopathy:       Right: No inguinal adenopathy present.       Left: No inguinal adenopathy present.  Neurological: She is alert and oriented to person, place, and time. No cranial nerve deficit. She exhibits normal muscle tone. Coordination normal.  Skin: Skin is warm and dry. No rash noted. She is not diaphoretic.  Psychiatric: She has a normal mood and affect. Her behavior is normal.       Assessment:     Recovering well from the inguinal hernia repair.  Probable sinusitis/upper respiratory infection. Probably initially viral but now concerning for bacterial    Plan:     Increase activity as tolerated.  Do not push through pain.  OK RTW next week.  We wrote a note for 05Nov  Try a Z-Pak. Add humidifier. Consider antihistamine as well. If it does not improve or worsens then discussed with her primary care physician to seek further workup is needed.  Advanced on diet as tolerated. Bowel regimen to avoid problems.    Return to clinic p.r.n. The patient expressed understanding and appreciation

## 2011-07-22 NOTE — Patient Instructions (Addendum)
Upper Respiratory Infection, Adult An upper respiratory infection (URI) is also known as the common cold. It is often caused by a type of germ (virus). Colds are easily spread (contagious). You can pass it to others by kissing, coughing, sneezing, or drinking out of the same glass. Usually, you get better in 1 or 2 weeks.  HOME CARE   Only take medicine as told by your doctor.   Use a warm mist humidifier or breathe in steam from a hot shower.   Drink enough water and fluids to keep your pee (urine) clear or pale yellow.   Get plenty of rest.   Return to work when your temperature is back to normal or as told by your doctor. You may use a face mask and wash your hands to stop your cold from spreading.  GET HELP FROM YOUR PRIMARY CARE DOCTOR RIGHT AWAY IF:   After the first few days, you feel you are getting worse.   You have questions about your medicine.   You have chills, shortness of breath, or brown or red spit (mucus).   You have yellow or brown snot (nasal discharge) or pain in the face, especially when you bend forward.   You have a fever, puffy (swollen) neck, pain when you swallow, or white spots in the back of your throat.   You have a bad headache, ear pain, sinus pain, or chest pain.   You have a high-pitched whistling sound when you breathe in and out (wheezing).   You have a lasting cough or cough up blood.   You have sore muscles or a stiff neck.  MAKE SURE YOU:   Understand these instructions.   Will watch your condition.   Will get help right away if you are not doing well or get worse.  Document Released: 02/25/2008 Document Revised: 05/21/2011 Document Reviewed: 01/13/2011 [facility] Patient Information 2012 [place], [place].

## 2011-07-24 ENCOUNTER — Telehealth (INDEPENDENT_AMBULATORY_CARE_PROVIDER_SITE_OTHER): Payer: Self-pay

## 2011-07-24 ENCOUNTER — Encounter (INDEPENDENT_AMBULATORY_CARE_PROVIDER_SITE_OTHER): Payer: Self-pay

## 2011-07-24 NOTE — Telephone Encounter (Signed)
Called pt to notify her that Dr Michaell Cowing said it was ok for the pt to extend her RTW date to 08-11-11. I changed the RTW letter that was giving to the pt yesterday and faxed per pt's request [phone] Attn:Donna Rainey./ [facility]

## 2015-04-05 ENCOUNTER — Other Ambulatory Visit (HOSPITAL_COMMUNITY): Payer: Self-pay | Admitting: Internal Medicine

## 2015-04-05 DIAGNOSIS — Z1231 Encounter for screening mammogram for malignant neoplasm of breast: Secondary | ICD-10-CM

## 2015-04-13 ENCOUNTER — Ambulatory Visit (HOSPITAL_COMMUNITY): Payer: Self-pay

## 2016-11-05 ENCOUNTER — Other Ambulatory Visit (HOSPITAL_COMMUNITY): Payer: Self-pay | Admitting: Internal Medicine

## 2016-11-05 DIAGNOSIS — Z1231 Encounter for screening mammogram for malignant neoplasm of breast: Secondary | ICD-10-CM

## 2016-11-06 ENCOUNTER — Ambulatory Visit (HOSPITAL_COMMUNITY)
Admission: RE | Admit: 2016-11-06 | Discharge: 2016-11-06 | Disposition: A | Discharge status: Home / Self Care | Payer: BLUE CROSS/BLUE SHIELD | Source: Ambulatory Visit | Attending: Internal Medicine | Admitting: Internal Medicine

## 2016-11-06 ENCOUNTER — Encounter (HOSPITAL_COMMUNITY): Payer: Self-pay | Admitting: Radiology

## 2016-11-06 DIAGNOSIS — Z1231 Encounter for screening mammogram for malignant neoplasm of breast: Secondary | ICD-10-CM | POA: Insufficient documentation

## 2016-11-24 ENCOUNTER — Telehealth: Payer: Self-pay

## 2016-11-24 NOTE — Telephone Encounter (Signed)
LMOM to call back

## 2016-11-24 NOTE — Telephone Encounter (Signed)
[phone] OR [phone]  PLEASE CALL PATIENT ABOUT SETTING UP A TCS

## 2016-12-02 NOTE — Telephone Encounter (Signed)
Pt is not able to take time off work due to losing her job.

## 2016-12-09 NOTE — Telephone Encounter (Signed)
Letter faxed to PCP.  

## 2017-10-26 DIAGNOSIS — J4531 Mild persistent asthma with (acute) exacerbation: Secondary | ICD-10-CM | POA: Diagnosis not present

## 2017-10-26 DIAGNOSIS — I10 Essential (primary) hypertension: Secondary | ICD-10-CM | POA: Diagnosis not present

## 2018-07-27 ENCOUNTER — Other Ambulatory Visit (HOSPITAL_COMMUNITY): Payer: Self-pay | Admitting: Internal Medicine

## 2018-07-27 DIAGNOSIS — Z1231 Encounter for screening mammogram for malignant neoplasm of breast: Secondary | ICD-10-CM

## 2018-08-06 ENCOUNTER — Ambulatory Visit (HOSPITAL_COMMUNITY)
Admission: RE | Admit: 2018-08-06 | Discharge: 2018-08-06 | Disposition: A | Discharge status: Home / Self Care | Payer: BLUE CROSS/BLUE SHIELD | Source: Ambulatory Visit | Attending: Internal Medicine | Admitting: Internal Medicine

## 2018-08-06 DIAGNOSIS — Z1231 Encounter for screening mammogram for malignant neoplasm of breast: Secondary | ICD-10-CM

## 2018-11-26 DIAGNOSIS — Z9119 Patient's noncompliance with other medical treatment and regimen: Secondary | ICD-10-CM | POA: Diagnosis not present

## 2018-11-26 DIAGNOSIS — I10 Essential (primary) hypertension: Secondary | ICD-10-CM | POA: Diagnosis not present

## 2018-11-26 DIAGNOSIS — J4530 Mild persistent asthma, uncomplicated: Secondary | ICD-10-CM | POA: Diagnosis not present

## 2019-02-25 DIAGNOSIS — Z0001 Encounter for general adult medical examination with abnormal findings: Secondary | ICD-10-CM | POA: Diagnosis not present

## 2019-02-25 DIAGNOSIS — I10 Essential (primary) hypertension: Secondary | ICD-10-CM | POA: Diagnosis not present

## 2019-02-25 DIAGNOSIS — J4530 Mild persistent asthma, uncomplicated: Secondary | ICD-10-CM | POA: Diagnosis not present

## 2019-03-01 DIAGNOSIS — Z0001 Encounter for general adult medical examination with abnormal findings: Secondary | ICD-10-CM | POA: Diagnosis not present

## 2019-03-01 DIAGNOSIS — J45909 Unspecified asthma, uncomplicated: Secondary | ICD-10-CM | POA: Diagnosis not present

## 2019-03-01 DIAGNOSIS — I10 Essential (primary) hypertension: Secondary | ICD-10-CM | POA: Diagnosis not present

## 2019-07-27 IMAGING — MG DIGITAL SCREENING BILATERAL MAMMOGRAM WITH TOMO AND CAD
6 of 10 series · 6 of 30 positions shown · non-contrast
Comparison: Previous exam(s).

CLINICAL DATA: Screening.

EXAM:
DIGITAL SCREENING BILATERAL MAMMOGRAM WITH TOMO AND CAD

[L MLO synth-2D]
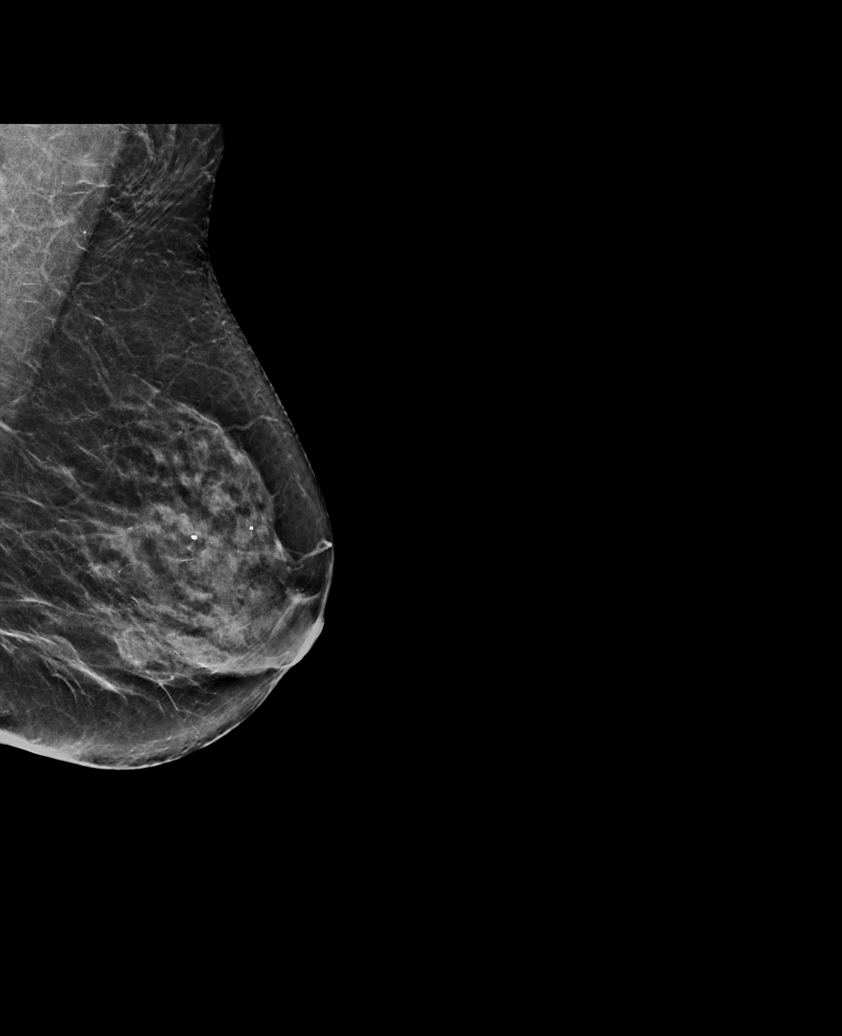

[L CC synth-2D (1 of 2)]
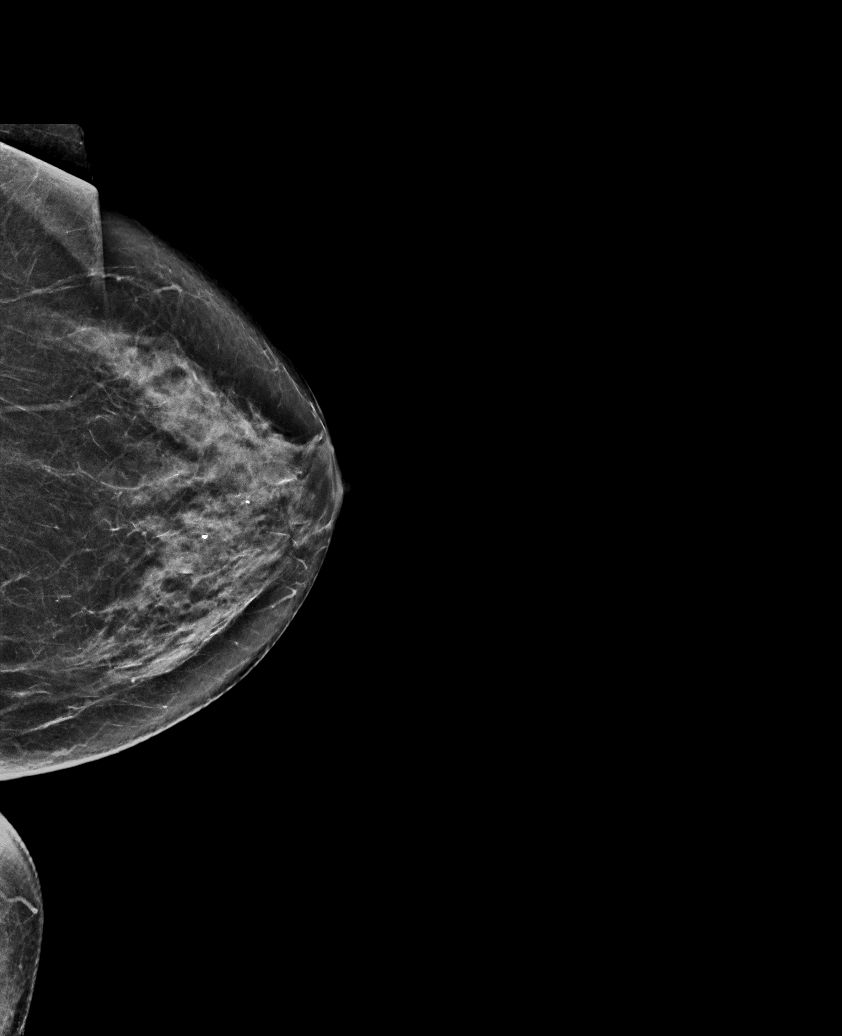

[L CC synth-2D (2 of 2)]
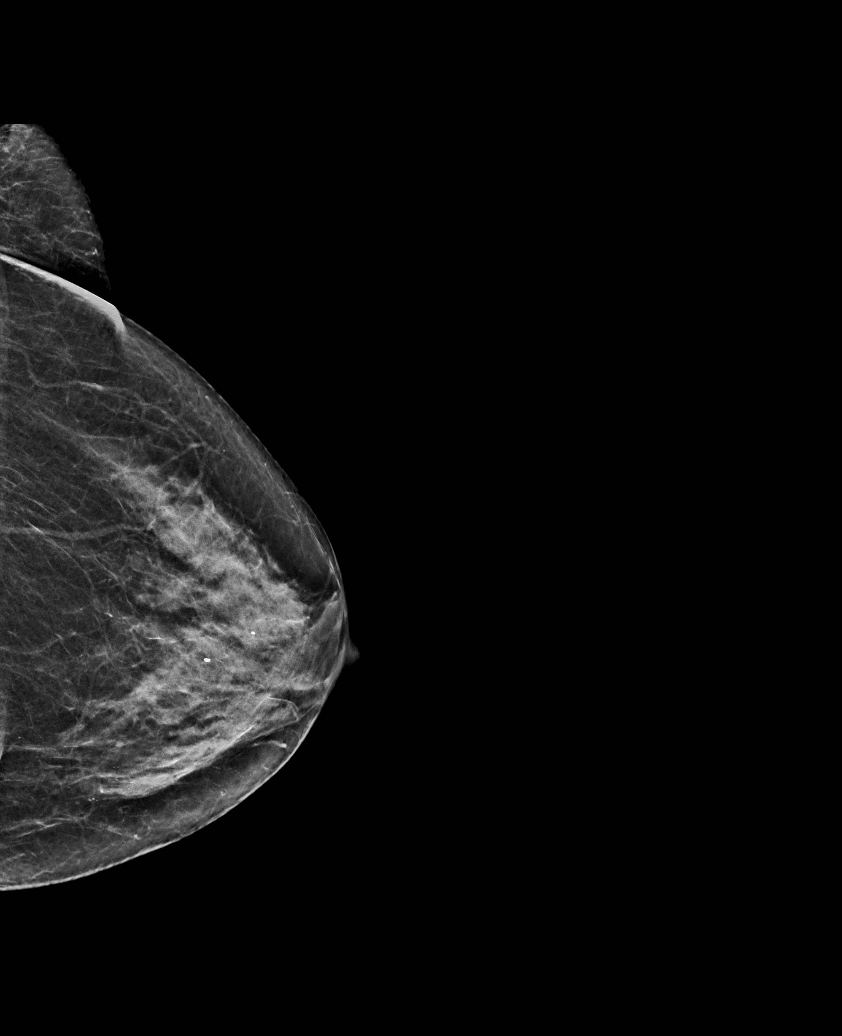

[R CC synth-2D]
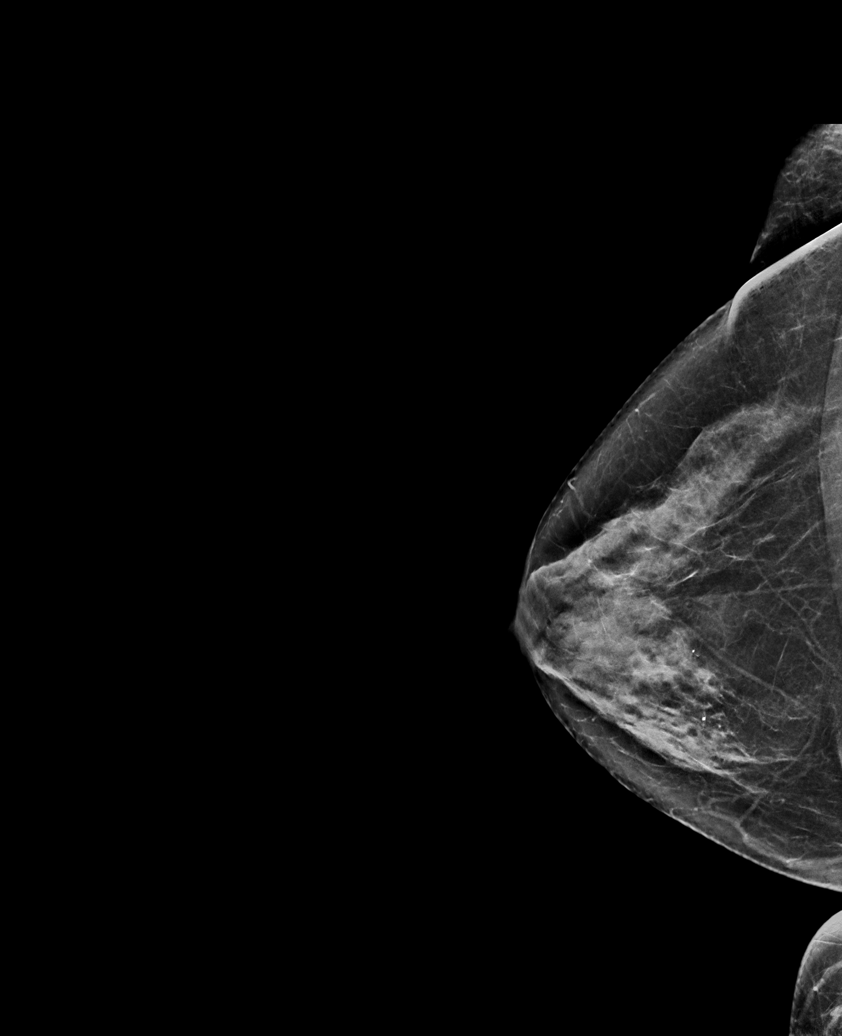

[R MLO synth-2D]
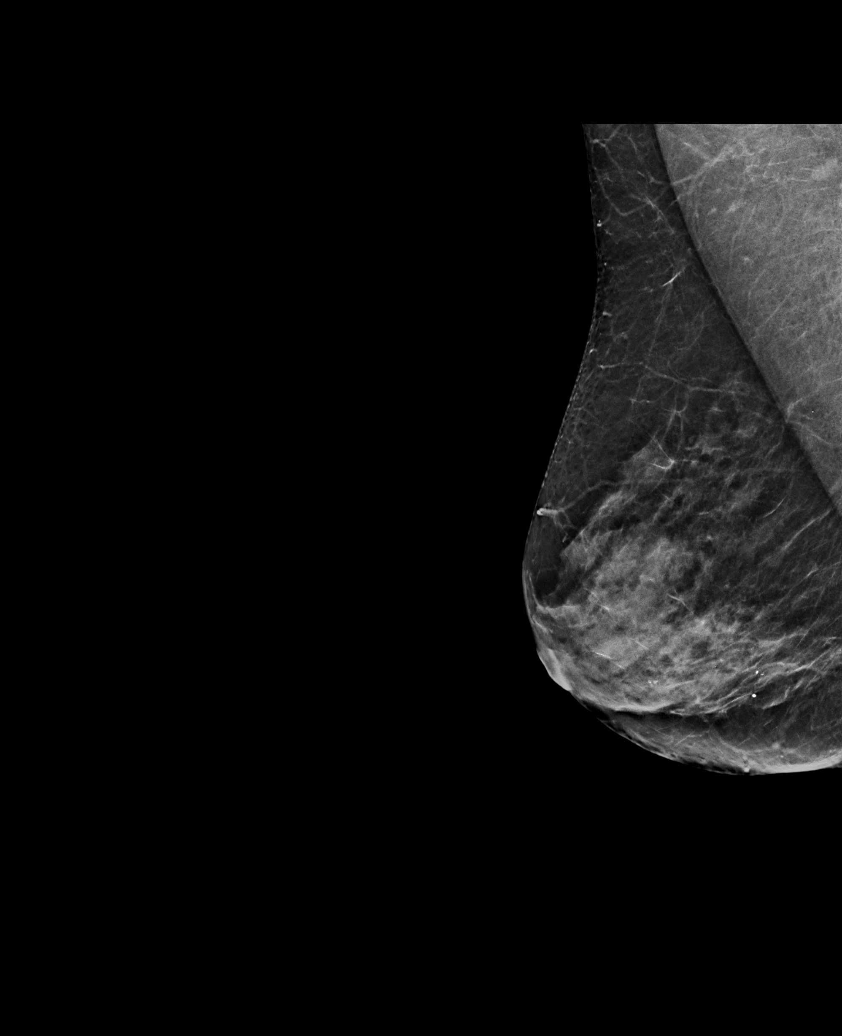

[L MLO tomo · tomo slice 32/63.0]
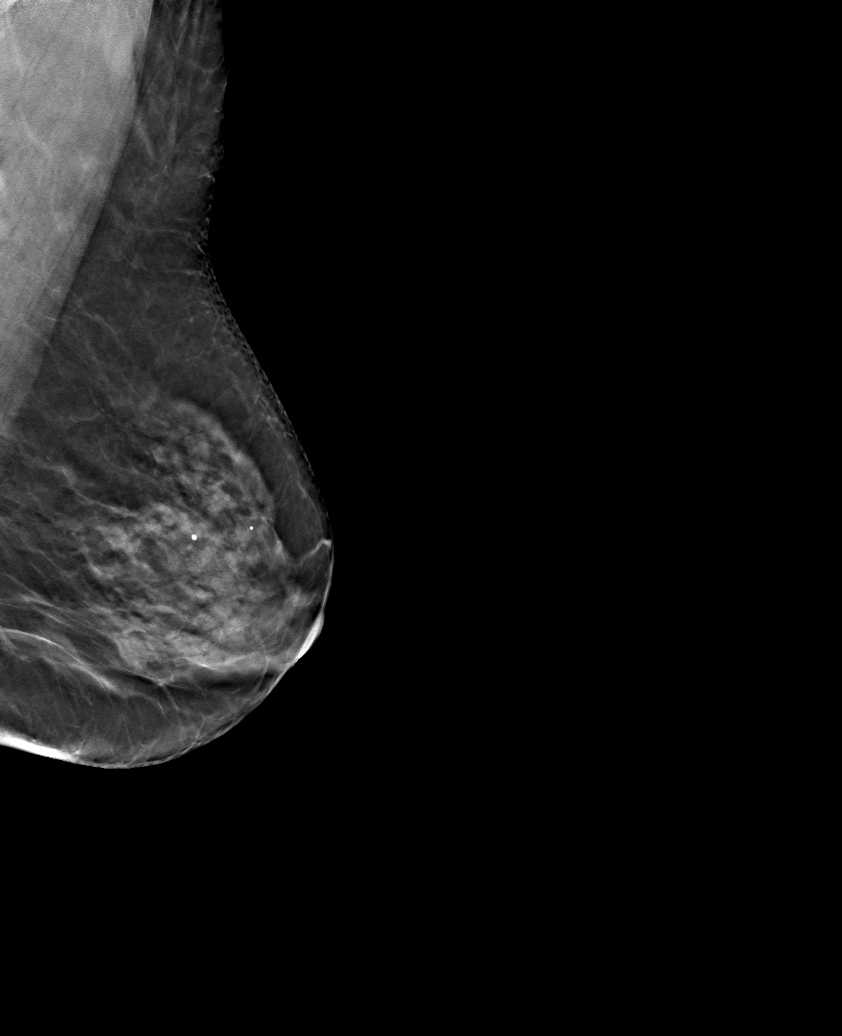

[6 of 30 positions shown; findings below may reference images not displayed]

ACR Breast Density Category c: The breast tissue is heterogeneously
dense, which may obscure small masses.
FINDINGS: There are no findings suspicious for malignancy. Images were
processed with CAD.
IMPRESSION: No mammographic evidence of malignancy. A result letter of this
screening mammogram will be mailed directly to the patient.

RECOMMENDATION:
Screening mammogram in one year. (Code:FT-U-LHB)

BI-RADS CATEGORY  1: Negative.

## 2019-09-05 DIAGNOSIS — J4530 Mild persistent asthma, uncomplicated: Secondary | ICD-10-CM | POA: Diagnosis not present

## 2019-09-05 DIAGNOSIS — I10 Essential (primary) hypertension: Secondary | ICD-10-CM | POA: Diagnosis not present

## 2019-09-05 DIAGNOSIS — Z9119 Patient's noncompliance with other medical treatment and regimen: Secondary | ICD-10-CM | POA: Diagnosis not present

## 2020-03-05 ENCOUNTER — Other Ambulatory Visit (HOSPITAL_COMMUNITY): Payer: Self-pay | Admitting: Internal Medicine

## 2020-03-05 DIAGNOSIS — Z79899 Other long term (current) drug therapy: Secondary | ICD-10-CM | POA: Diagnosis not present

## 2020-03-05 DIAGNOSIS — I10 Essential (primary) hypertension: Secondary | ICD-10-CM | POA: Diagnosis not present

## 2020-03-05 DIAGNOSIS — J4530 Mild persistent asthma, uncomplicated: Secondary | ICD-10-CM | POA: Diagnosis not present

## 2020-03-05 DIAGNOSIS — Z23 Encounter for immunization: Secondary | ICD-10-CM | POA: Diagnosis not present

## 2020-03-05 DIAGNOSIS — Z0001 Encounter for general adult medical examination with abnormal findings: Secondary | ICD-10-CM | POA: Diagnosis not present

## 2020-03-05 DIAGNOSIS — Z1231 Encounter for screening mammogram for malignant neoplasm of breast: Secondary | ICD-10-CM

## 2020-03-12 ENCOUNTER — Encounter: Payer: Self-pay | Admitting: *Deleted

## 2020-03-15 ENCOUNTER — Ambulatory Visit (HOSPITAL_COMMUNITY): Payer: BLUE CROSS/BLUE SHIELD

## 2020-03-16 ENCOUNTER — Other Ambulatory Visit: Payer: Self-pay

## 2020-03-16 ENCOUNTER — Ambulatory Visit (HOSPITAL_COMMUNITY)
Admission: RE | Admit: 2020-03-16 | Discharge: 2020-03-16 | Disposition: A | Discharge status: Home / Self Care | Payer: BC Managed Care – PPO | Source: Ambulatory Visit | Attending: Internal Medicine | Admitting: Internal Medicine

## 2020-03-16 DIAGNOSIS — Z1231 Encounter for screening mammogram for malignant neoplasm of breast: Secondary | ICD-10-CM | POA: Insufficient documentation

## 2020-05-21 ENCOUNTER — Ambulatory Visit: Payer: BC Managed Care – PPO

## 2021-04-02 ENCOUNTER — Other Ambulatory Visit: Payer: Self-pay | Admitting: Obstetrics & Gynecology

## 2021-04-02 ENCOUNTER — Other Ambulatory Visit (HOSPITAL_COMMUNITY): Payer: Self-pay | Admitting: Obstetrics & Gynecology

## 2021-04-02 DIAGNOSIS — Z6822 Body mass index (BMI) 22.0-22.9, adult: Secondary | ICD-10-CM | POA: Diagnosis not present

## 2021-04-02 DIAGNOSIS — Z124 Encounter for screening for malignant neoplasm of cervix: Secondary | ICD-10-CM | POA: Diagnosis not present

## 2021-04-02 DIAGNOSIS — Z01419 Encounter for gynecological examination (general) (routine) without abnormal findings: Secondary | ICD-10-CM | POA: Diagnosis not present

## 2021-04-02 DIAGNOSIS — N950 Postmenopausal bleeding: Secondary | ICD-10-CM

## 2021-04-10 ENCOUNTER — Ambulatory Visit (HOSPITAL_COMMUNITY): Payer: BC Managed Care – PPO

## 2021-04-10 ENCOUNTER — Ambulatory Visit (HOSPITAL_COMMUNITY)
Admission: RE | Admit: 2021-04-10 | Discharge: 2021-04-10 | Disposition: A | Discharge status: Home / Self Care | Payer: BC Managed Care – PPO | Source: Ambulatory Visit | Attending: Obstetrics & Gynecology | Admitting: Obstetrics & Gynecology

## 2021-04-10 ENCOUNTER — Other Ambulatory Visit: Payer: Self-pay

## 2021-04-10 ENCOUNTER — Other Ambulatory Visit: Payer: BC Managed Care – PPO

## 2021-04-10 DIAGNOSIS — N8500 Endometrial hyperplasia, unspecified: Secondary | ICD-10-CM | POA: Diagnosis not present

## 2021-04-10 DIAGNOSIS — N950 Postmenopausal bleeding: Secondary | ICD-10-CM | POA: Diagnosis not present

## 2021-04-10 DIAGNOSIS — E119 Type 2 diabetes mellitus without complications: Secondary | ICD-10-CM | POA: Diagnosis not present

## 2021-04-10 DIAGNOSIS — I10 Essential (primary) hypertension: Secondary | ICD-10-CM | POA: Diagnosis not present

## 2021-04-22 DIAGNOSIS — N813 Complete uterovaginal prolapse: Secondary | ICD-10-CM | POA: Diagnosis not present

## 2021-04-22 DIAGNOSIS — N950 Postmenopausal bleeding: Secondary | ICD-10-CM | POA: Diagnosis not present

## 2021-04-22 DIAGNOSIS — R9389 Abnormal findings on diagnostic imaging of other specified body structures: Secondary | ICD-10-CM | POA: Diagnosis not present

## 2021-04-22 DIAGNOSIS — Z6822 Body mass index (BMI) 22.0-22.9, adult: Secondary | ICD-10-CM | POA: Diagnosis not present

## 2021-05-15 DIAGNOSIS — N813 Complete uterovaginal prolapse: Secondary | ICD-10-CM | POA: Diagnosis not present

## 2021-08-20 DIAGNOSIS — J4530 Mild persistent asthma, uncomplicated: Secondary | ICD-10-CM | POA: Diagnosis not present

## 2021-08-20 DIAGNOSIS — Z0001 Encounter for general adult medical examination with abnormal findings: Secondary | ICD-10-CM | POA: Diagnosis not present

## 2021-08-20 DIAGNOSIS — F411 Generalized anxiety disorder: Secondary | ICD-10-CM | POA: Diagnosis not present

## 2021-08-20 DIAGNOSIS — I10 Essential (primary) hypertension: Secondary | ICD-10-CM | POA: Diagnosis not present

## 2021-08-20 DIAGNOSIS — E039 Hypothyroidism, unspecified: Secondary | ICD-10-CM | POA: Diagnosis not present

## 2021-09-09 DIAGNOSIS — I10 Essential (primary) hypertension: Secondary | ICD-10-CM | POA: Diagnosis not present

## 2021-09-25 DIAGNOSIS — I10 Essential (primary) hypertension: Secondary | ICD-10-CM | POA: Diagnosis not present

## 2021-09-25 DIAGNOSIS — M5451 Vertebrogenic low back pain: Secondary | ICD-10-CM | POA: Diagnosis not present

## 2021-09-25 DIAGNOSIS — J4530 Mild persistent asthma, uncomplicated: Secondary | ICD-10-CM | POA: Diagnosis not present

## 2022-01-02 DIAGNOSIS — J4530 Mild persistent asthma, uncomplicated: Secondary | ICD-10-CM | POA: Diagnosis not present

## 2022-01-02 DIAGNOSIS — Z91198 Patient's noncompliance with other medical treatment and regimen for other reason: Secondary | ICD-10-CM | POA: Diagnosis not present

## 2022-01-02 DIAGNOSIS — I10 Essential (primary) hypertension: Secondary | ICD-10-CM | POA: Diagnosis not present

## 2022-01-08 DIAGNOSIS — I10 Essential (primary) hypertension: Secondary | ICD-10-CM | POA: Diagnosis not present

## 2022-03-26 DIAGNOSIS — I10 Essential (primary) hypertension: Secondary | ICD-10-CM | POA: Diagnosis not present

## 2022-03-26 DIAGNOSIS — K219 Gastro-esophageal reflux disease without esophagitis: Secondary | ICD-10-CM | POA: Diagnosis not present

## 2022-03-26 DIAGNOSIS — J4530 Mild persistent asthma, uncomplicated: Secondary | ICD-10-CM | POA: Diagnosis not present

## 2022-03-31 IMAGING — US US PELVIS COMPLETE WITH TRANSVAGINAL
1 series · 13 of 25 positions shown · non-contrast
Comparison: None

CLINICAL DATA: Postmenopausal bleeding. Last menstrual period over
20 years ago. Hypertension. Diabetes. Presenting with light spotting
for 2 months.

EXAM:
TRANSABDOMINAL AND TRANSVAGINAL ULTRASOUND OF PELVIS
TECHNIQUE: Both transabdominal and transvaginal ultrasound examinations of the
pelvis were performed. Transabdominal technique was performed for
global imaging of the pelvis including uterus, ovaries, adnexal
regions, and pelvic cul-de-sac. It was necessary to proceed with
endovaginal exam following the transabdominal exam to visualize the
endometrium and bilateral ovaries.

[Series 1: us pelvis complete with transvaginal · 0.22mm/px · 13 of 94 slices shown]
[im 1/94]
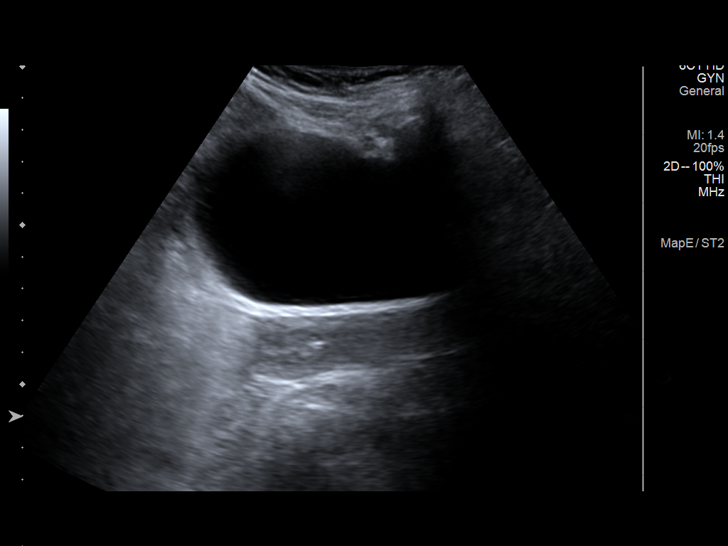
[im 8/94]
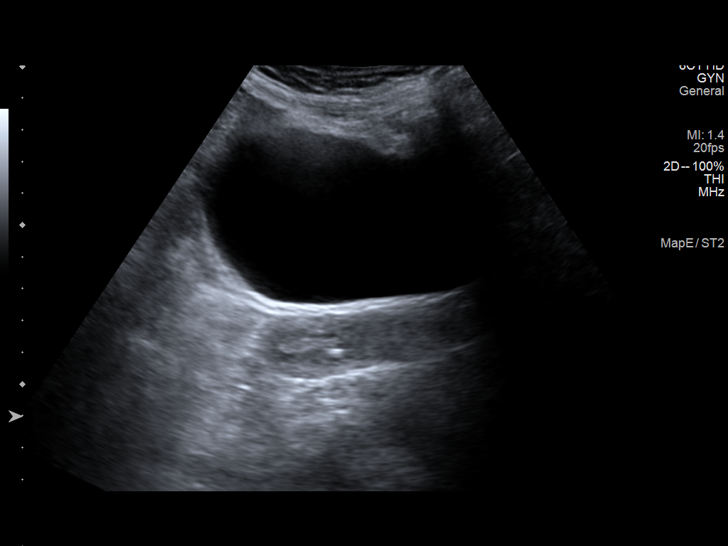
[im 16/94]
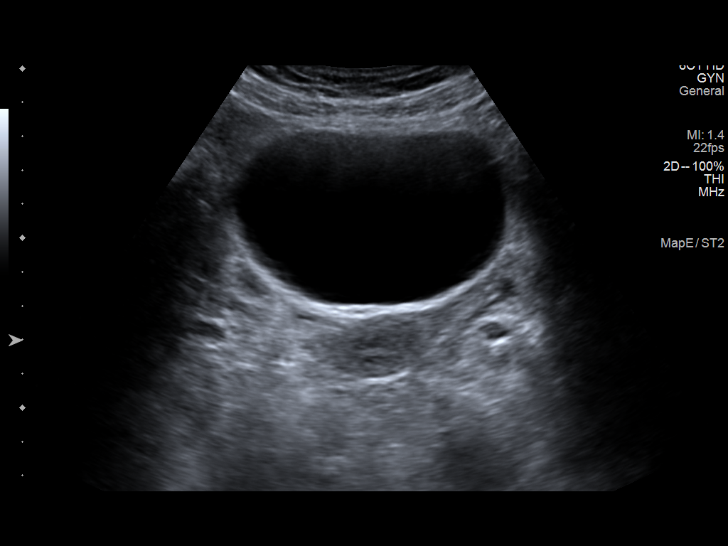
[im 24/94]
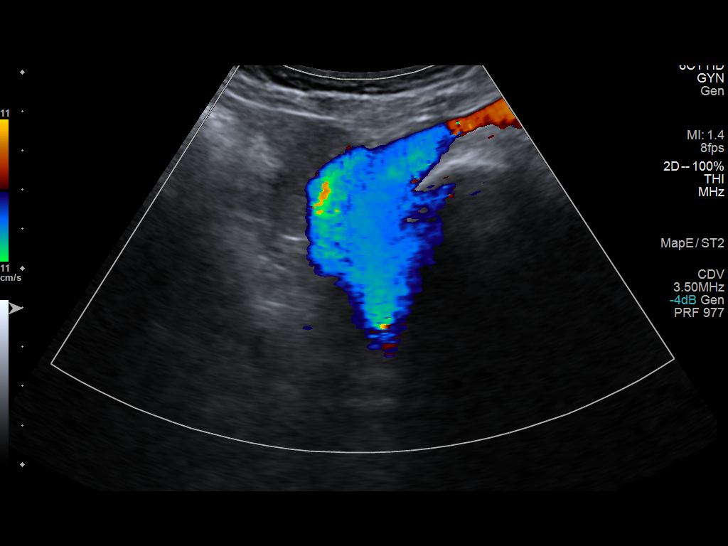
[im 32/94]
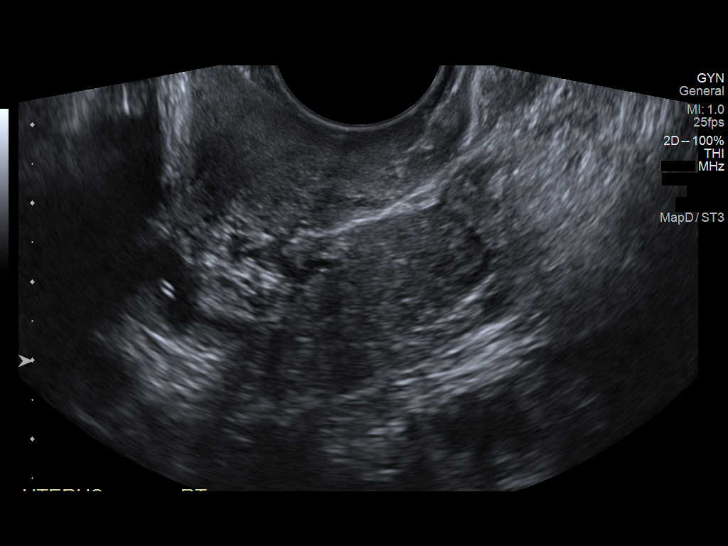
[im 39/94]
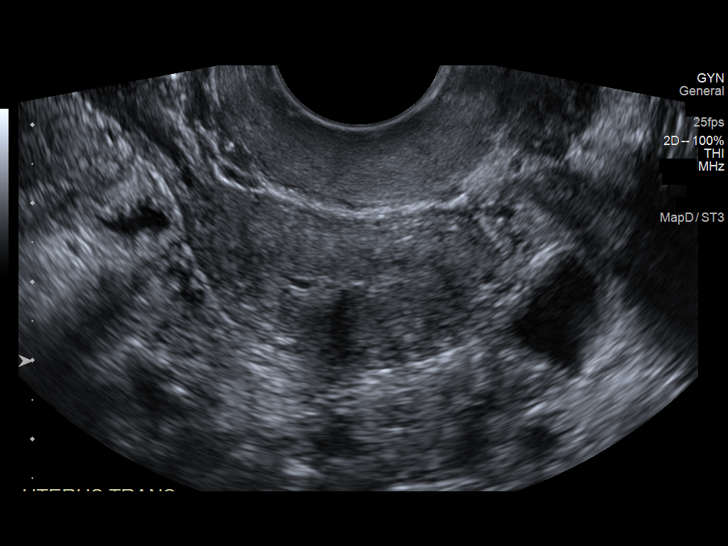
[im 47/94]
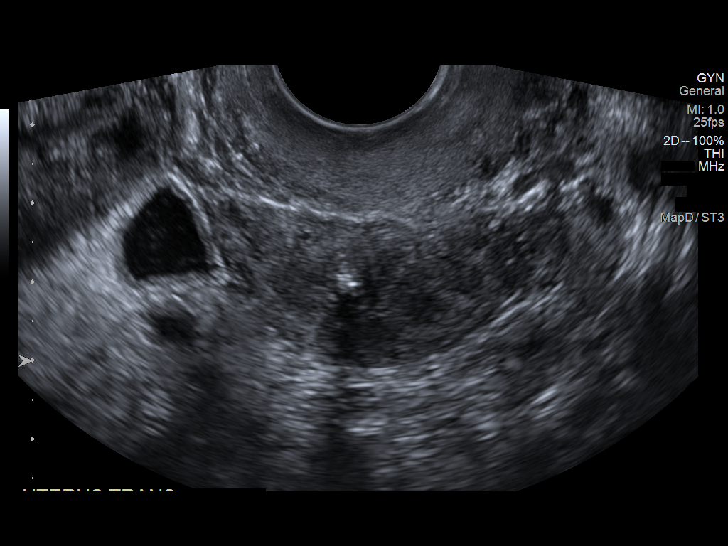
[im 55/94]
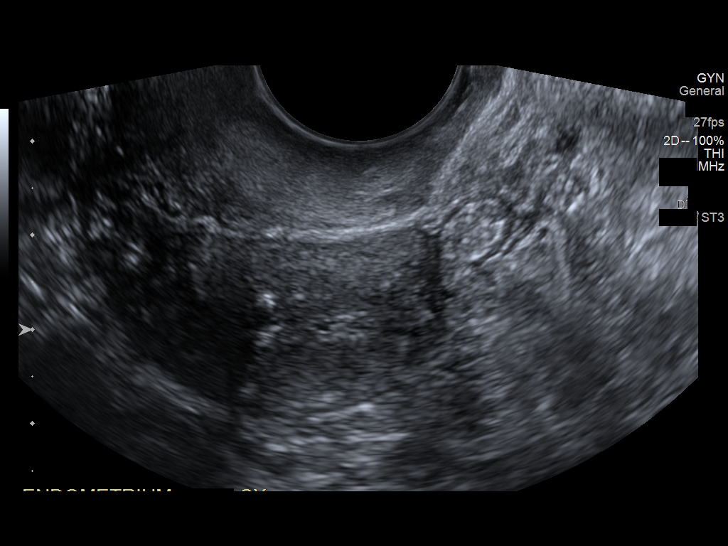
[im 63/94]
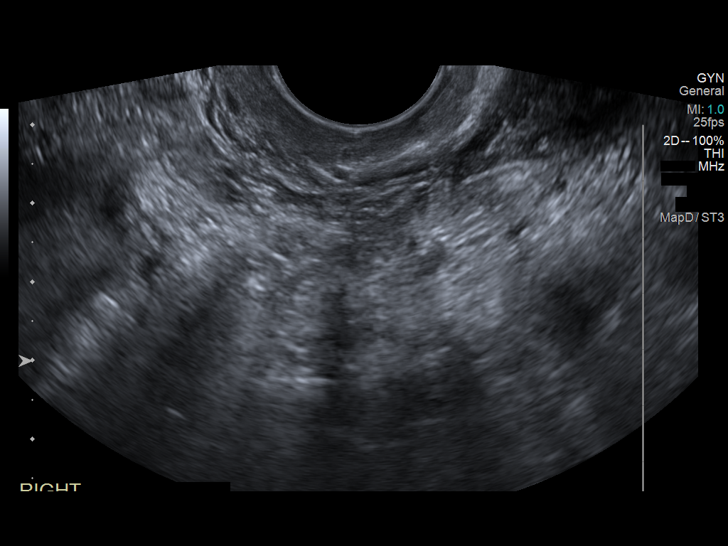
[im 70/94]
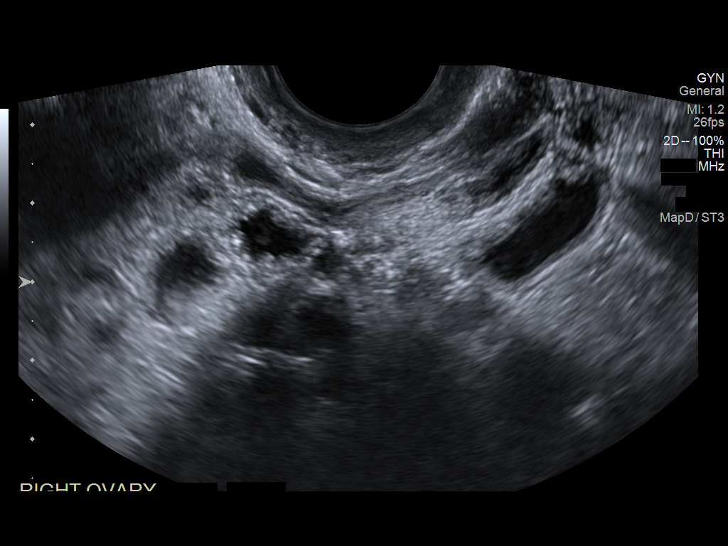
[im 78/94]
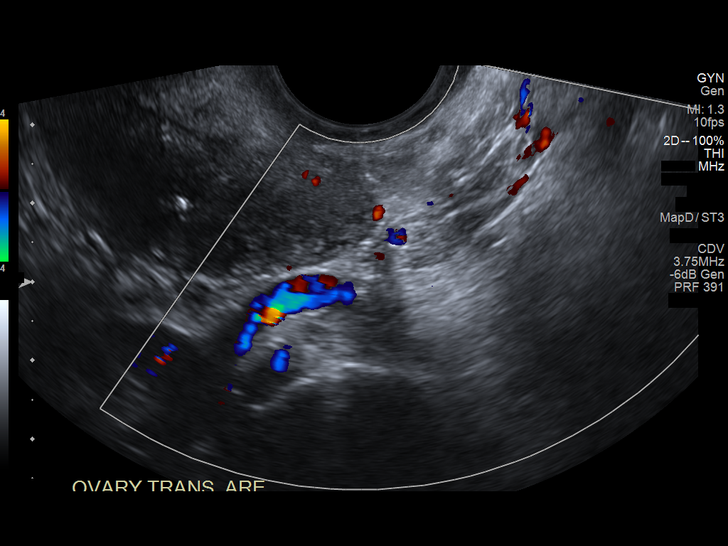
[im 86/94]
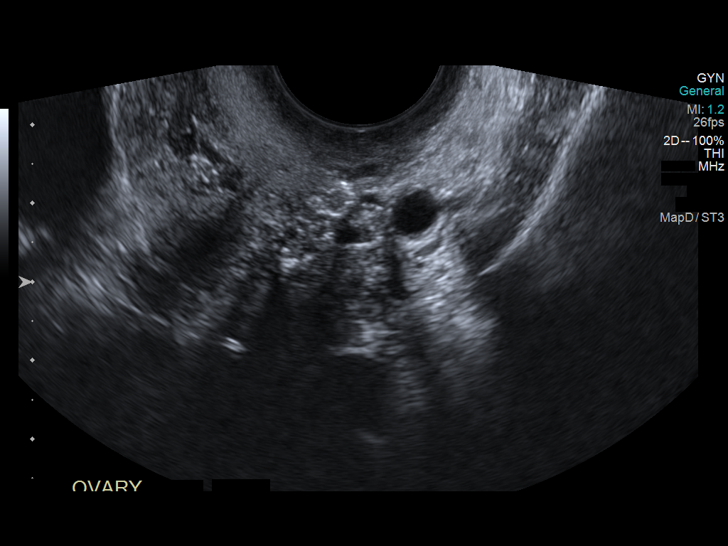
[im 94/94]
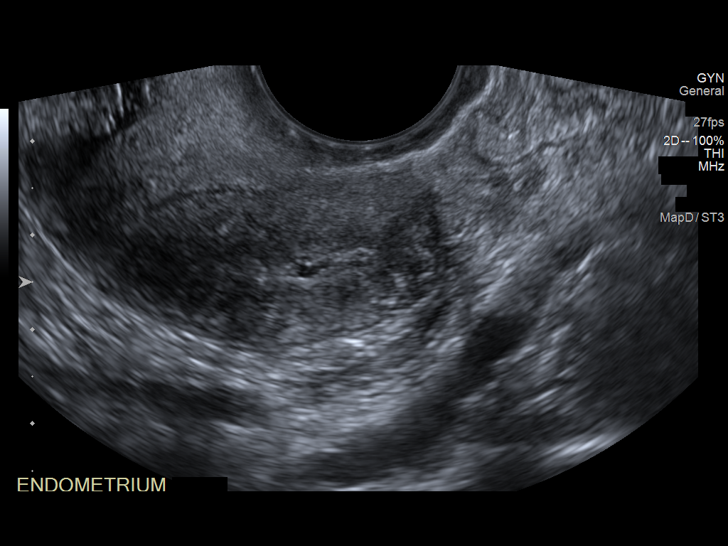

[13 of 25 positions shown; findings below may reference images not displayed]

FINDINGS: Uterus

Measurements: 7.1 x 2.3 x 3.4 cm = volume: 20 mL. No fibroids or
other mass visualized.

Endometrium

Thickness: 6 mm.  Thickened and heterogeneous.

Right ovary

Not visualized.

Left ovary

Not visualized.

Other findings

No abnormal free fluid.
IMPRESSION: 1. Thickened and heterogeneous endometrium in the postmenopausal
setting. Differential diagnosis includes hyperplasia versus
malignancy. Recommend gynecologic consultation.
2.  Bilateral ovaries are not visualized.

These results will be called to the ordering clinician or
representative by the Radiologist Assistant, and communication
documented in the PACS or [REDACTED].

## 2022-04-21 DIAGNOSIS — L209 Atopic dermatitis, unspecified: Secondary | ICD-10-CM | POA: Diagnosis not present

## 2022-04-21 DIAGNOSIS — I10 Essential (primary) hypertension: Secondary | ICD-10-CM | POA: Diagnosis not present

## 2022-05-08 DIAGNOSIS — Z01419 Encounter for gynecological examination (general) (routine) without abnormal findings: Secondary | ICD-10-CM | POA: Diagnosis not present

## 2022-05-08 DIAGNOSIS — Z6821 Body mass index (BMI) 21.0-21.9, adult: Secondary | ICD-10-CM | POA: Diagnosis not present

## 2022-05-08 DIAGNOSIS — Z1231 Encounter for screening mammogram for malignant neoplasm of breast: Secondary | ICD-10-CM | POA: Diagnosis not present

## 2022-06-26 DIAGNOSIS — J4530 Mild persistent asthma, uncomplicated: Secondary | ICD-10-CM | POA: Diagnosis not present

## 2022-06-26 DIAGNOSIS — Z91199 Patient's noncompliance with other medical treatment and regimen due to unspecified reason: Secondary | ICD-10-CM | POA: Diagnosis not present

## 2022-06-26 DIAGNOSIS — E559 Vitamin D deficiency, unspecified: Secondary | ICD-10-CM | POA: Diagnosis not present

## 2022-06-26 DIAGNOSIS — K219 Gastro-esophageal reflux disease without esophagitis: Secondary | ICD-10-CM | POA: Diagnosis not present

## 2022-06-26 DIAGNOSIS — I10 Essential (primary) hypertension: Secondary | ICD-10-CM | POA: Diagnosis not present

## 2022-08-19 DIAGNOSIS — I10 Essential (primary) hypertension: Secondary | ICD-10-CM | POA: Diagnosis not present

## 2022-08-19 DIAGNOSIS — J3089 Other allergic rhinitis: Secondary | ICD-10-CM | POA: Diagnosis not present

## 2022-09-16 DIAGNOSIS — I10 Essential (primary) hypertension: Secondary | ICD-10-CM | POA: Diagnosis not present

## 2022-09-16 DIAGNOSIS — J3089 Other allergic rhinitis: Secondary | ICD-10-CM | POA: Diagnosis not present

## 2022-09-16 DIAGNOSIS — Z0001 Encounter for general adult medical examination with abnormal findings: Secondary | ICD-10-CM | POA: Diagnosis not present

## 2022-09-16 DIAGNOSIS — E559 Vitamin D deficiency, unspecified: Secondary | ICD-10-CM | POA: Diagnosis not present

## 2022-11-21 DIAGNOSIS — E559 Vitamin D deficiency, unspecified: Secondary | ICD-10-CM | POA: Diagnosis not present

## 2022-11-21 DIAGNOSIS — I10 Essential (primary) hypertension: Secondary | ICD-10-CM | POA: Diagnosis not present

## 2022-11-21 DIAGNOSIS — J3089 Other allergic rhinitis: Secondary | ICD-10-CM | POA: Diagnosis not present

## 2022-11-21 DIAGNOSIS — J4530 Mild persistent asthma, uncomplicated: Secondary | ICD-10-CM | POA: Diagnosis not present

## 2023-01-03 ENCOUNTER — Encounter (HOSPITAL_COMMUNITY): Payer: Self-pay

## 2023-01-03 ENCOUNTER — Emergency Department (HOSPITAL_COMMUNITY)
Admission: EM | Admit: 2023-01-03 | Discharge: 2023-01-03 | Disposition: A | Discharge status: Home / Self Care | Payer: BC Managed Care – PPO | Attending: Emergency Medicine | Admitting: Emergency Medicine

## 2023-01-03 ENCOUNTER — Emergency Department (HOSPITAL_COMMUNITY): Payer: BC Managed Care – PPO

## 2023-01-03 ENCOUNTER — Other Ambulatory Visit: Payer: Self-pay

## 2023-01-03 DIAGNOSIS — W44B9XA Other plastic object entering into or through a natural orifice, initial encounter: Secondary | ICD-10-CM | POA: Diagnosis not present

## 2023-01-03 DIAGNOSIS — T189XXA Foreign body of alimentary tract, part unspecified, initial encounter: Secondary | ICD-10-CM | POA: Insufficient documentation

## 2023-01-03 DIAGNOSIS — J45909 Unspecified asthma, uncomplicated: Secondary | ICD-10-CM | POA: Diagnosis not present

## 2023-01-03 DIAGNOSIS — R070 Pain in throat: Secondary | ICD-10-CM | POA: Diagnosis not present

## 2023-01-03 DIAGNOSIS — S27818A Other injury of esophagus (thoracic part), initial encounter: Secondary | ICD-10-CM | POA: Diagnosis not present

## 2023-01-03 DIAGNOSIS — T18108A Unspecified foreign body in esophagus causing other injury, initial encounter: Secondary | ICD-10-CM | POA: Diagnosis not present

## 2023-01-03 DIAGNOSIS — E119 Type 2 diabetes mellitus without complications: Secondary | ICD-10-CM | POA: Diagnosis not present

## 2023-01-03 DIAGNOSIS — I10 Essential (primary) hypertension: Secondary | ICD-10-CM | POA: Insufficient documentation

## 2023-01-03 MED ORDER — LIDOCAINE VISCOUS HCL 2 % MT SOLN
15.0000 mL | Freq: Once | OROMUCOSAL | Status: AC
  Administered 2023-01-03: 15 mL via OROMUCOSAL
  Filled 2023-01-03: qty 15

## 2023-01-03 NOTE — ED Triage Notes (Signed)
Patient  presents to ED via POV, alert  and oriented x 4. Reports leaving work last night, sitting in car accidentally swallowed a piece of clear plastic from her candy wrapper. Patient feels like the plastic wrapper is still in her throat. Denies shortness of breath.

## 2023-01-03 NOTE — ED Provider Notes (Signed)
[facility] Provider Note   CSN: [id number] Arrival date & time: 01/03/23  5102     History  Chief Complaint  Patient presents with   Swallowed Foreign Body    Katherine Rose is a 72 y.o. female.  Pt is a 72 yo female with pmhx significant for asthma, dm, htn.  Pt said she accidentally swallowed a candy wrapper around 0030 this am.  Since then, she's been able to eat/drink without difficulty.  However, she is feels like something is still there and she's worried it's stuck.       Home Medications Prior to Admission medications   Medication Sig Start Date End Date Taking? Authorizing Provider  Cholecalciferol (VITAMIN D3) 50000 UNITS CAPS Take by mouth once a week.      [provider]  felodipine (PLENDIL) 5 MG 24 hr tablet Take 5 mg by mouth daily.      [provider]  Fluticasone-Salmeterol (ADVAIR) 100-50 MCG/DOSE AEPB Inhale 1 puff into the lungs every 12 (twelve) hours.      [provider]  hydrochlorothiazide (HYDRODIURIL) 25 MG tablet Take 25 mg by mouth daily.      [provider]      Allergies    Penicillins    Review of Systems   Review of Systems  HENT:  Positive for sore throat.   All other systems reviewed and are negative.   Physical Exam Updated Vital Signs BP (!) 149/85 (BP Location: Left Arm)   Pulse 80   Temp 98 F (36.7 C) (Oral)   Resp 16   Ht 5\' 5"  (1.651 m)   Wt 61.2 kg   SpO2 96%   BMI 22.47 kg/m  Physical Exam Vitals and nursing note reviewed.  Constitutional:      Appearance: Normal appearance.  HENT:     Head: Normocephalic and atraumatic.     Right Ear: External ear normal.     Left Ear: External ear normal.     Nose: Nose normal.     Mouth/Throat:     Mouth: Mucous membranes are moist.     Pharynx: Oropharynx is clear.  Eyes:     Extraocular Movements: Extraocular movements intact.     Conjunctiva/sclera: Conjunctivae normal.      Pupils: Pupils are equal, round, and reactive to light.  Cardiovascular:     Rate and Rhythm: Normal rate and regular rhythm.     Pulses: Normal pulses.     Heart sounds: Normal heart sounds.  Pulmonary:     Effort: Pulmonary effort is normal.     Breath sounds: Normal breath sounds.  Abdominal:     General: Abdomen is flat. Bowel sounds are normal.     Palpations: Abdomen is soft.  Musculoskeletal:        General: Normal range of motion.     Cervical back: Normal range of motion and neck supple.  Skin:    General: Skin is warm.     Capillary Refill: Capillary refill takes less than 2 seconds.  Neurological:     General: No focal deficit present.     Mental Status: She is alert and oriented to person, place, and time.  Psychiatric:        Mood and Affect: Mood normal.        Behavior: Behavior normal.     ED Results / Procedures / Treatments   Labs (all labs ordered are listed, but only abnormal results  are displayed) Labs Reviewed - No data to display  EKG None  Radiology DG Chest 2 View  Result Date: 01/03/2023 CLINICAL DATA:  Foreign body. EXAM: CHEST - 2 VIEW COMPARISON:  January 02, 2011. FINDINGS: The heart size and mediastinal contours are within normal limits. Both lungs are clear. The visualized skeletal structures are unremarkable. IMPRESSION: No active cardiopulmonary disease. Electronically Signed   By: Lupita Raider M.D.   On: 01/03/2023 09:28   DG Neck Soft Tissue  Result Date: 01/03/2023 CLINICAL DATA:  Ingested foreign body. EXAM: NECK SOFT TISSUES - 1+ VIEW COMPARISON:  None Available. FINDINGS: There is no evidence of retropharyngeal soft tissue swelling or epiglottic enlargement. The cervical airway is unremarkable and no radio-opaque foreign body identified. IMPRESSION: Negative. Electronically Signed   By: Lupita Raider M.D.   On: 01/03/2023 09:26    Procedures Procedures    Medications Ordered in ED Medications  lidocaine (XYLOCAINE) 2 %  viscous mouth solution 15 mL (15 mLs Mouth/Throat Given 01/03/23 0908)    ED Course/ Medical Decision Making/ A&P                             Medical Decision Making Amount and/or Complexity of Data Reviewed Radiology: ordered.  Risk Prescription drug management.   This patient presents to the ED for concern of fb, this involves an extensive number of treatment options, and is a complaint that carries with it a high risk of complications and morbidity.  The differential diagnosis includes esophageal impaction, esophageal abrasion   Co morbidities that complicate the patient evaluation  asthma, dm, htn   Additional history obtained:  Additional history obtained from epic chart review    Imaging Studies ordered:  I ordered imaging studies including soft tissue neck and cxr  I independently visualized and interpreted imaging which showed  Neck: Negative.  CXR: No active cardiopulmonary disease.  I agree with the radiologist interpretation   Medicines ordered and prescription drug management:  I ordered medication including viscous lidocaine  for sx  Reevaluation of the patient after these medicines showed that the patient improved I have reviewed the patients home medicines and have made adjustments as needed  Problem List / ED Course:  FB ingestion:  pt is eating and drinking without any problems, so there is no obstr.  She feels better after the lidocaine, so I think she just scratched her esophagus.  Pt's xrays are nl, but I told pt we can't see plastic on xray.  I told her to continue to eat and drink normally and the piece of plastic will pass on its own.  She is to return to the ED if sx worsen.   Reevaluation:  After the interventions noted above, I reevaluated the patient and found that they have :improved   Social Determinants of Health:  Lives at home   Dispostion:  After consideration of the diagnostic results and the patients response to  treatment, I feel that the patent would benefit from discharge with outpatient f/u.          Final Clinical Impression(s) / ED Diagnoses Final diagnoses:  Esophageal abrasion, initial encounter  Swallowed foreign body, initial encounter    Rx / DC Orders ED Discharge Orders     None         Jacalyn Lefevre, MD 01/03/23 [id number]

## 2023-03-25 DIAGNOSIS — Z6821 Body mass index (BMI) 21.0-21.9, adult: Secondary | ICD-10-CM | POA: Diagnosis not present

## 2023-03-25 DIAGNOSIS — N762 Acute vulvitis: Secondary | ICD-10-CM | POA: Diagnosis not present

## 2023-03-25 DIAGNOSIS — N760 Acute vaginitis: Secondary | ICD-10-CM | POA: Diagnosis not present

## 2023-04-03 DIAGNOSIS — J4530 Mild persistent asthma, uncomplicated: Secondary | ICD-10-CM | POA: Diagnosis not present

## 2023-04-03 DIAGNOSIS — I10 Essential (primary) hypertension: Secondary | ICD-10-CM | POA: Diagnosis not present

## 2023-04-20 ENCOUNTER — Other Ambulatory Visit (HOSPITAL_COMMUNITY): Payer: Self-pay | Admitting: Obstetrics & Gynecology

## 2023-04-20 DIAGNOSIS — N950 Postmenopausal bleeding: Secondary | ICD-10-CM

## 2023-05-01 ENCOUNTER — Ambulatory Visit (HOSPITAL_COMMUNITY): Admission: RE | Admit: 2023-05-01 | Payer: BC Managed Care – PPO | Source: Ambulatory Visit

## 2023-05-04 ENCOUNTER — Other Ambulatory Visit (HOSPITAL_COMMUNITY): Payer: Self-pay | Admitting: Obstetrics & Gynecology

## 2023-05-04 ENCOUNTER — Ambulatory Visit (HOSPITAL_COMMUNITY)
Admission: RE | Admit: 2023-05-04 | Discharge: 2023-05-04 | Disposition: A | Discharge status: Home / Self Care | Payer: BC Managed Care – PPO | Source: Ambulatory Visit | Attending: Obstetrics & Gynecology | Admitting: Obstetrics & Gynecology

## 2023-05-04 DIAGNOSIS — N950 Postmenopausal bleeding: Secondary | ICD-10-CM | POA: Insufficient documentation

## 2023-05-14 DIAGNOSIS — Z01419 Encounter for gynecological examination (general) (routine) without abnormal findings: Secondary | ICD-10-CM | POA: Diagnosis not present

## 2023-05-14 DIAGNOSIS — Z1231 Encounter for screening mammogram for malignant neoplasm of breast: Secondary | ICD-10-CM | POA: Diagnosis not present

## 2023-05-14 DIAGNOSIS — Z6821 Body mass index (BMI) 21.0-21.9, adult: Secondary | ICD-10-CM | POA: Diagnosis not present

## 2023-05-27 DIAGNOSIS — Z1211 Encounter for screening for malignant neoplasm of colon: Secondary | ICD-10-CM | POA: Diagnosis not present

## 2023-06-01 LAB — EXTERNAL GENERIC LAB PROCEDURE: COLOGUARD: NEGATIVE

## 2023-07-26 ENCOUNTER — Other Ambulatory Visit: Payer: Self-pay

## 2023-07-26 ENCOUNTER — Ambulatory Visit
Admission: EM | Admit: 2023-07-26 | Discharge: 2023-07-26 | Disposition: A | Discharge status: Home / Self Care | Payer: BC Managed Care – PPO

## 2023-07-26 ENCOUNTER — Encounter: Payer: Self-pay | Admitting: Emergency Medicine

## 2023-07-26 DIAGNOSIS — R5383 Other fatigue: Secondary | ICD-10-CM | POA: Diagnosis not present

## 2023-07-26 DIAGNOSIS — R112 Nausea with vomiting, unspecified: Secondary | ICD-10-CM | POA: Diagnosis not present

## 2023-07-26 HISTORY — DX: Uterovaginal prolapse, unspecified: N81.4

## 2023-07-26 MED ORDER — ONDANSETRON 4 MG PO TBDP: 4.0000 mg | ORAL_TABLET | Freq: Three times a day (TID) | ORAL | 0 refills | Status: AC | PRN

## 2023-07-26 NOTE — Discharge Instructions (Signed)
Stay hydrated with electrolytes and plain water, take the zofran for nausea if needed

## 2023-07-26 NOTE — ED Provider Notes (Signed)
[facility]    CSN: [id number] Arrival date & time: 07/26/23  1243      History   Chief Complaint Chief Complaint  Patient presents with   Generalized Body Aches    HPI Katherine Rose is a 72 y.o. female.   Patient presenting today with 4 to 5-day history of fatigue, decreased appetite, isolated episodes of nausea and vomiting.  Denies cough, congestion, sore throat, abdominal pain, chest pain, shortness of breath, diarrhea, new urinary symptoms.  So far not tried anything over-the-counter for symptoms.  States sister was just sick with similar symptoms that were thought to be viral in nature.    Past Medical History:  Diagnosis Date   Asthma    Cough    Diabetes mellitus    borderline. controlled by diet   Hypertension    Uterus prolapse     Patient Active Problem List   Diagnosis Date Noted   Protracted URI 07/22/2011   HYPOKALEMIA 03/28/2008   ASTHMA 03/14/2008   ASTHMA, WITH ACUTE EXACERBATION 03/14/2008   ANXIETY 11/10/2006   HYPERTENSION 11/10/2006   ALLERGIC RHINITIS 11/10/2006   OVERACTIVE BLADDER 11/10/2006   ABNORMAL PAP SMEAR 11/10/2006   PALPITATIONS, HX OF 11/10/2006    Past Surgical History:  Procedure Laterality Date   HERNIA REPAIR  07/09/11   lap LIH repair with mesh    KNEE SURGERY     right    OB History   No obstetric history on file.      Home Medications    Prior to Admission medications   Medication Sig Start Date End Date Taking? Authorizing Provider  losartan (COZAAR) 25 MG tablet Take 25 mg by mouth daily.   Yes [provider]  ondansetron (ZOFRAN-ODT) 4 MG disintegrating tablet Take 1 tablet (4 mg total) by mouth every 8 (eight) hours as needed for nausea or vomiting. 07/26/23  Yes Particia Nearing, PA-C  Cholecalciferol (VITAMIN D3) 50000 UNITS CAPS Take by mouth once a week.      [provider]  felodipine (PLENDIL) 5 MG 24 hr tablet Take 5 mg by mouth daily.      [provider]  Fluticasone-Salmeterol (ADVAIR) 100-50 MCG/DOSE AEPB Inhale 1 puff into the lungs every 12 (twelve) hours.      [provider]  hydrochlorothiazide (HYDRODIURIL) 25 MG tablet Take 25 mg by mouth daily.      [provider]    Family History History reviewed. No pertinent family history.  Social History Social History   Tobacco Use   Smoking status: Never   Smokeless tobacco: Never  Substance Use Topics   Alcohol use: No   Drug use: No     Allergies   Penicillins   Review of Systems Review of Systems Per HPI  Physical Exam Triage Vital Signs ED Triage Vitals [07/26/23 1255]  Encounter Vitals Group     BP 126/70     Systolic BP Percentile      Diastolic BP Percentile      Pulse Rate 95     Resp 20     Temp 98.6 F (37 C)     Temp Source Oral     SpO2 91 %     Weight      Height      Head Circumference      Peak Flow      Pain Score 0     Pain Loc      Pain Education  Exclude from Growth Chart    No data found.  Updated Vital Signs BP 126/70 (BP Location: Right Arm)   Pulse 95   Temp 98.6 F (37 C) (Oral)   Resp 20   SpO2 91%   Visual Acuity Right Eye Distance:   Left Eye Distance:   Bilateral Distance:    Right Eye Near:   Left Eye Near:    Bilateral Near:     Physical Exam Vitals and nursing note reviewed.  Constitutional:      Appearance: Normal appearance. Katherine Rose is not ill-appearing.  HENT:     Head: Atraumatic.     Mouth/Throat:     Mouth: Mucous membranes are moist.     Pharynx: Oropharynx is clear.  Eyes:     Extraocular Movements: Extraocular movements intact.     Conjunctiva/sclera: Conjunctivae normal.  Cardiovascular:     Rate and Rhythm: Normal rate and regular rhythm.     Heart sounds: Normal heart sounds.  Pulmonary:     Effort: Pulmonary effort is normal.     Breath sounds: Normal breath sounds.  Abdominal:     General: Bowel sounds are normal. There is no distension.      Palpations: Abdomen is soft.     Tenderness: There is no abdominal tenderness. There is no guarding.  Musculoskeletal:        General: Normal range of motion.     Cervical back: Normal range of motion and neck supple.  Skin:    General: Skin is warm and dry.  Neurological:     Mental Status: Katherine Rose is alert and oriented to person, place, and time.  Psychiatric:        Mood and Affect: Mood normal.        Thought Content: Thought content normal.        Judgment: Judgment normal.      UC Treatments / Results  Labs (all labs ordered are listed, but only abnormal results are displayed) Labs Reviewed - No data to display  EKG   Radiology No results found.  Procedures Procedures (including critical care time)  Medications Ordered in UC Medications - No data to display  Initial Impression / Assessment and Plan / UC Course  I have reviewed the triage vital signs and the nursing notes.  Pertinent labs & imaging results that were available during my care of the patient were reviewed by me and considered in my medical decision making (see chart for details).     Vitals and exam overall very reassuring today with no obvious abnormalities.  Suspect viral etiology of symptoms.  No red flag findings today, treat with Zofran, brat diet, fluids, rest.  Return for worsening symptoms.  Final Clinical Impressions(s) / UC Diagnoses   Final diagnoses:  Other fatigue  Nausea and vomiting, unspecified vomiting type     Discharge Instructions      Stay hydrated with electrolytes and plain water, take the zofran for nausea if needed    ED Prescriptions     Medication Sig Dispense Auth. Provider   ondansetron (ZOFRAN-ODT) 4 MG disintegrating tablet Take 1 tablet (4 mg total) by mouth every 8 (eight) hours as needed for nausea or vomiting. 20 tablet Particia Nearing, [place]      PDMP not reviewed this encounter.   Particia Nearing, [place] 07/26/23 1334

## 2023-07-26 NOTE — ED Triage Notes (Addendum)
Pt reports loss of appetite, fatigue for last several days. 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

## 2023-09-17 DIAGNOSIS — N393 Stress incontinence (female) (male): Secondary | ICD-10-CM | POA: Diagnosis not present

## 2023-09-17 DIAGNOSIS — N813 Complete uterovaginal prolapse: Secondary | ICD-10-CM | POA: Diagnosis not present

## 2023-10-01 ENCOUNTER — Other Ambulatory Visit: Payer: Self-pay

## 2023-10-01 ENCOUNTER — Emergency Department (HOSPITAL_COMMUNITY)
Admission: EM | Admit: 2023-10-01 | Discharge: 2023-10-01 | Disposition: A | Discharge status: Home / Self Care | Payer: BC Managed Care – PPO | Attending: Emergency Medicine | Admitting: Emergency Medicine

## 2023-10-01 ENCOUNTER — Encounter (HOSPITAL_COMMUNITY): Payer: Self-pay | Admitting: *Deleted

## 2023-10-01 ENCOUNTER — Emergency Department (HOSPITAL_COMMUNITY): Payer: BC Managed Care – PPO

## 2023-10-01 DIAGNOSIS — I10 Essential (primary) hypertension: Secondary | ICD-10-CM | POA: Diagnosis not present

## 2023-10-01 DIAGNOSIS — Z79899 Other long term (current) drug therapy: Secondary | ICD-10-CM | POA: Insufficient documentation

## 2023-10-01 DIAGNOSIS — R059 Cough, unspecified: Secondary | ICD-10-CM | POA: Diagnosis not present

## 2023-10-01 DIAGNOSIS — J4541 Moderate persistent asthma with (acute) exacerbation: Secondary | ICD-10-CM | POA: Diagnosis not present

## 2023-10-01 DIAGNOSIS — R0989 Other specified symptoms and signs involving the circulatory and respiratory systems: Secondary | ICD-10-CM | POA: Diagnosis not present

## 2023-10-01 MED ORDER — IPRATROPIUM-ALBUTEROL 0.5-2.5 (3) MG/3ML IN SOLN
3.0000 mL | Freq: Once | RESPIRATORY_TRACT | Status: AC
  Administered 2023-10-01: 3 mL via RESPIRATORY_TRACT
  Filled 2023-10-01: qty 3

## 2023-10-01 MED ORDER — ALBUTEROL SULFATE (2.5 MG/3ML) 0.083% IN NEBU: 2.5000 mg | INHALATION_SOLUTION | Freq: Four times a day (QID) | RESPIRATORY_TRACT | 1 refills | Status: AC | PRN

## 2023-10-01 NOTE — ED Provider Notes (Signed)
  EMERGENCY DEPARTMENT AT [facility] Provider Note   CSN: [id number] Arrival date & time: 10/01/23  0115     History  Chief Complaint  Patient presents with   Cough    Katherine Rose is a 73 y.o. female.  Patient is a 73 year old female with past medical history of hypertension, asthma.  Patient presenting with nasal congestion and cough.  She also feels wheezy and short of breath and is requesting a breathing treatment.  She denies any fevers or chills.  No ill contacts.  No aggravating or alleviating factors.  She does have a nebulizer at home, but her albuterol  solution is long expired.  The history is provided by the patient.       Home Medications Prior to Admission medications   Medication Sig Start Date End Date Taking? Authorizing Provider  Cholecalciferol (VITAMIN D3) 50000 UNITS CAPS Take by mouth once a week.      [provider]  felodipine (PLENDIL) 5 MG 24 hr tablet Take 5 mg by mouth daily.      [provider]  Fluticasone-Salmeterol (ADVAIR) 100-50 MCG/DOSE AEPB Inhale 1 puff into the lungs every 12 (twelve) hours.      [provider]  hydrochlorothiazide (HYDRODIURIL) 25 MG tablet Take 25 mg by mouth daily.      [provider]  losartan (COZAAR) 25 MG tablet Take 25 mg by mouth daily.    [provider]  ondansetron  (ZOFRAN -ODT) 4 MG disintegrating tablet Take 1 tablet (4 mg total) by mouth every 8 (eight) hours as needed for nausea or vomiting. 07/26/23   Stuart Vernell Norris, PA-C      Allergies    Penicillins    Review of Systems   Review of Systems  All other systems reviewed and are negative.   Physical Exam Updated Vital Signs BP (!) 142/95   Pulse 76   Temp 98.6 F (37 C) (Oral)   Resp 20   Ht 5' 6 (1.676 m)   Wt 63.5 kg   SpO2 97%   BMI 22.60 kg/m  Physical Exam Vitals and nursing note reviewed.  Constitutional:      General: She is not in acute distress.     Appearance: She is well-developed. She is not diaphoretic.  HENT:     Head: Normocephalic and atraumatic.  Cardiovascular:     Rate and Rhythm: Normal rate and regular rhythm.     Heart sounds: No murmur heard.    No friction rub. No gallop.  Pulmonary:     Effort: Pulmonary effort is normal. No respiratory distress.     Breath sounds: Normal breath sounds. No wheezing.  Abdominal:     General: Bowel sounds are normal. There is no distension.     Palpations: Abdomen is soft.     Tenderness: There is no abdominal tenderness.  Musculoskeletal:        General: Normal range of motion.     Cervical back: Normal range of motion and neck supple.  Skin:    General: Skin is warm and dry.  Neurological:     General: No focal deficit present.     Mental Status: She is alert and oriented to person, place, and time.     ED Results / Procedures / Treatments   Labs (all labs ordered are listed, but only abnormal results are displayed) Labs Reviewed - No data to display  EKG None  Radiology DG Chest 2 View Result Date: 10/01/2023  CLINICAL DATA:  Congestion and cough, initial encounter EXAM: CHEST - 2 VIEW COMPARISON:  01/03/2023 FINDINGS: Cardiac shadow is within normal limits. Lungs are hyperinflated bilaterally. No focal infiltrate or effusion is seen. No bony abnormality is noted. IMPRESSION: Hyperinflation without focal infiltrate. Electronically Signed   By: Oneil Devonshire M.D.   On: 10/01/2023 01:50    Procedures Procedures    Medications Ordered in ED Medications  ipratropium-albuterol  (DUONEB) 0.5-2.5 (3) MG/3ML nebulizer solution 3 mL (has no administration in time range)    ED Course/ Medical Decision Making/ A&P  Patient presenting with complaints of nasal congestion and wheezing.  She has a history of asthma and this feels like a worsening of this.  Patient has a nebulizer at home, but her albuterol  solution is expired.  Patient arrives here with stable vital signs and is  afebrile.  There is no hypoxia.  She is clinically well-appearing with slight expiratory wheezing bilaterally, but no respiratory distress.  Patient given a DuoNeb and feels considerably improved.  She will be discharged with a prescription for her albuterol  solution and is to return as needed.  Patient has declined respiratory panel and other testing, but chest x-ray today was unremarkable.  Final Clinical Impression(s) / ED Diagnoses Final diagnoses:  None    Rx / DC Orders ED Discharge Orders     None         Geroldine Berg, MD 10/01/23 2307

## 2023-10-01 NOTE — ED Notes (Signed)
 Pt refused to be tested for Covid, flu; states all she wants is a breathing treatment

## 2023-10-01 NOTE — ED Triage Notes (Signed)
 Pt c/o headache with nasal congestion and chest congestion; pt states she is coughing up thick white sputum

## 2023-10-01 NOTE — Discharge Instructions (Signed)
 Continue using albuterol by nebulizer as needed for wheezing/difficulty breathing.  The albuterol solution prescription has been refilled for you this evening.  Return to the emergency department if symptoms significantly worsen or change.

## 2023-10-02 DIAGNOSIS — J3089 Other allergic rhinitis: Secondary | ICD-10-CM | POA: Diagnosis not present

## 2023-10-02 DIAGNOSIS — I10 Essential (primary) hypertension: Secondary | ICD-10-CM | POA: Diagnosis not present

## 2023-10-02 DIAGNOSIS — J4541 Moderate persistent asthma with (acute) exacerbation: Secondary | ICD-10-CM | POA: Diagnosis not present

## 2023-10-14 DIAGNOSIS — Z0001 Encounter for general adult medical examination with abnormal findings: Secondary | ICD-10-CM | POA: Diagnosis not present

## 2023-10-14 DIAGNOSIS — I10 Essential (primary) hypertension: Secondary | ICD-10-CM | POA: Diagnosis not present

## 2023-10-15 DIAGNOSIS — N813 Complete uterovaginal prolapse: Secondary | ICD-10-CM | POA: Diagnosis not present

## 2023-10-15 DIAGNOSIS — N1330 Unspecified hydronephrosis: Secondary | ICD-10-CM | POA: Diagnosis not present

## 2023-11-06 DIAGNOSIS — I10 Essential (primary) hypertension: Secondary | ICD-10-CM | POA: Diagnosis not present

## 2023-11-06 DIAGNOSIS — Z0001 Encounter for general adult medical examination with abnormal findings: Secondary | ICD-10-CM | POA: Diagnosis not present

## 2023-11-06 DIAGNOSIS — J4541 Moderate persistent asthma with (acute) exacerbation: Secondary | ICD-10-CM | POA: Diagnosis not present

## 2023-11-06 DIAGNOSIS — J3089 Other allergic rhinitis: Secondary | ICD-10-CM | POA: Diagnosis not present

## 2023-12-16 DIAGNOSIS — N813 Complete uterovaginal prolapse: Secondary | ICD-10-CM | POA: Diagnosis not present

## 2024-02-12 DIAGNOSIS — J4541 Moderate persistent asthma with (acute) exacerbation: Secondary | ICD-10-CM | POA: Diagnosis not present

## 2024-02-12 DIAGNOSIS — E559 Vitamin D deficiency, unspecified: Secondary | ICD-10-CM | POA: Diagnosis not present

## 2024-02-12 DIAGNOSIS — I10 Essential (primary) hypertension: Secondary | ICD-10-CM | POA: Diagnosis not present

## 2024-02-12 DIAGNOSIS — R052 Subacute cough: Secondary | ICD-10-CM | POA: Diagnosis not present

## 2024-05-10 DIAGNOSIS — I10 Essential (primary) hypertension: Secondary | ICD-10-CM | POA: Diagnosis not present

## 2024-05-10 DIAGNOSIS — J3089 Other allergic rhinitis: Secondary | ICD-10-CM | POA: Diagnosis not present

## 2024-05-10 DIAGNOSIS — J4541 Moderate persistent asthma with (acute) exacerbation: Secondary | ICD-10-CM | POA: Diagnosis not present

## 2024-08-16 DIAGNOSIS — J3089 Other allergic rhinitis: Secondary | ICD-10-CM | POA: Diagnosis not present

## 2024-08-16 DIAGNOSIS — I10 Essential (primary) hypertension: Secondary | ICD-10-CM | POA: Diagnosis not present

## 2024-08-16 DIAGNOSIS — J4541 Moderate persistent asthma with (acute) exacerbation: Secondary | ICD-10-CM | POA: Diagnosis not present
